# Patient Record
Sex: Male | Born: 1995 | Race: White | Hispanic: Yes | Marital: Married | Smoking: Never smoker
Health system: Southern US, Community
[De-identification: ages and names within clinical notes are randomized; demographics above are authoritative.]

---

## 2020-08-09 ENCOUNTER — Ambulatory Visit: Payer: Self-pay

## 2020-08-09 ENCOUNTER — Encounter: Payer: Self-pay | Admitting: Orthopaedic Surgery

## 2020-08-09 ENCOUNTER — Ambulatory Visit: Payer: BC Managed Care – PPO | Admitting: Orthopaedic Surgery

## 2020-08-09 ENCOUNTER — Other Ambulatory Visit: Payer: Self-pay

## 2020-08-09 VITALS — Ht 71.0 in | Wt 239.0 lb

## 2020-08-09 DIAGNOSIS — M25512 Pain in left shoulder: Secondary | ICD-10-CM | POA: Diagnosis not present

## 2020-08-09 DIAGNOSIS — S43015D Anterior dislocation of left humerus, subsequent encounter: Secondary | ICD-10-CM

## 2020-08-09 NOTE — Progress Notes (Signed)
Office Visit Note   Patient: Jared Graham          Date of Birth: 11/05/1995           MRN: [id number] Visit Date: 08/09/2020              Requested by: No referring provider defined for this encounter. PCP: No primary care provider on file.   Assessment & Plan: Visit Diagnoses:  1. Acute pain of left shoulder   2. Dislocation, shoulder, anterior, left, subsequent encounter     Plan: Jared Graham is accompanied by his wife and here for follow-up evaluation of a dislocation of his left nondominant shoulder.  He was in the [employer] in [place] when on 2 December sustained a dislocation of the shoulder as he was involved in a low crawl and then forcibly hit his elbow on the ground dislocating his shoulder.  He was seen initially at the emergency room in [facility] with the reduction.  He was seen in follow-up by an orthopedist in [place] but was not happy with the encounter.  He still having considerable pain at the point of compromise.  I will order an MRI arthrogram and keep him out of work until he has had further evaluation this certainly is a possibility of a rotator cuff tear or even labral pathology with persistent pain with overhead activity  Follow-Up Instructions: Return After MRI arthrogram left shoulder.   Orders:  Orders Placed This Encounter  Procedures  . XR Shoulder Left  . DL FLUORO GUIDED NEEDLE PLC ASPIRATION / INJECTTION/LOC  . MR Shoulder Left w/ contrast   No orders of the defined types were placed in this encounter.     Procedures: No procedures performed   Clinical Data: No additional findings.   Subjective: Chief Complaint  Patient presents with  . 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  He was seen in at [facility] ED in [place], [place] where the shoulder was reduced. Patient was  subsequently seen for follow up with an orthopedic doctor in [place], however, states no new imaging was obtained and only a follow up appointment was made. He has complaint of decreased range of motion when reaching behind or reaching overhead. He also continues to have numbness along the shoulder. He was told that he tore a muscle along the anterior chest which he describes as sensitive to touch. He has increasing pain depending on movement.  He takes ibuprofen as needed. Patient denies any previous injuries or surgeries to left shoulder. Having trouble working as he is having pain with overhead positioning.  No prior history of shoulder dislocation or subluxation HPI  Review of Systems   Objective: Vital Signs: Ht 5\' 11"  (1.803 m)   Wt 239 lb (108.4 kg)   BMI 33.33 kg/m   Physical Exam Constitutional:      Appearance: He is well-developed and well-nourished.  HENT:     Mouth/Throat:     Mouth: Oropharynx is clear and moist.  Eyes:     Extraocular Movements: EOM normal.     Pupils: Pupils are equal, round, and reactive to light.  Pulmonary:     Effort: Pulmonary effort is normal.  Skin:    General: Skin is warm and dry.  Neurological:     Mental Status: He is alert and oriented to person, place, and time.  Psychiatric:        Mood and Affect: Mood and affect normal.        Behavior: Behavior normal.     Ortho Exam awake alert and oriented x3.  Comfortable sitting.  Did have pain with overhead motion of his left shoulder which required both active and passive positioning.  Pain with about 70 degrees of abduction in the subacromial and anterior aspect of the shoulder.  Biceps intact.  Good grip and good release.  Does have a little loss of sensation a very small area along the lateral deltoid.  No popping or clicking  Specialty Comments:  No specialty comments available.  Imaging: XR Shoulder Left  Result Date: 08/09/2020 Films of the left shoulder obtained in several  projections.  Patient has had a prior dislocation treated in [place] about a month ago.  Shoulder is located.  No evidence of any complicating features    PMFS History: Patient Active Problem List   Diagnosis Date Noted  . Dislocation, shoulder, anterior, left, subsequent encounter 08/09/2020   History reviewed. No pertinent past medical history.  History reviewed. No pertinent family history.  History reviewed. No pertinent surgical history. Social History   Occupational History  . Not on file  Tobacco Use  . Smoking status: Never Smoker  . Smokeless tobacco: Never Used  Substance and Sexual Activity  . Alcohol use: Yes  . Drug use: Not on file  . Sexual activity: Not on file

## 2020-09-07 ENCOUNTER — Ambulatory Visit
Admission: RE | Admit: 2020-09-07 | Discharge: 2020-09-07 | Disposition: A | Discharge status: Home / Self Care | Payer: BC Managed Care – PPO | Source: Ambulatory Visit | Attending: Orthopaedic Surgery | Admitting: Orthopaedic Surgery

## 2020-09-07 ENCOUNTER — Other Ambulatory Visit: Payer: Self-pay

## 2020-09-07 DIAGNOSIS — M25512 Pain in left shoulder: Secondary | ICD-10-CM

## 2020-09-07 IMAGING — XA DG FLUORO GUIDE NDL PLC/BX
3 series · 3 of 3 positions shown · IV contrast (multihance)
Comparison: none

CLINICAL DATA: Left shoulder pain.

EXAM:
LEFT SHOULDER INJECTION UNDER FLUOROSCOPY
TECHNIQUE: An appropriate skin entrance site was determined. The site was
marked, prepped with Betadine, draped in the usual sterile fashion,
and infiltrated locally with 1% lidocaine. A 22 gauge spinal needle
was advanced to the superomedial margin of the humeral head under
intermittent fluoroscopy. 1 mL of 1% lidocaine injected easily. A
mixture of 0.1 mL of MultiHance, 15 mL of Isovue-M 200, and 5 mL of
sterile saline was then used to opacify the left shoulder capsule.
12 mL of this mixture were injected. No immediate complication.
FLUOROSCOPY TIME:  Fluoroscopy Time:  10 seconds
Radiation Exposure Index (if provided by the fluoroscopic device):
9.13 microGray*m^2
Number of Acquired Spot Images: 0

[Series 1: ortho standard · 1 of 1 slices shown (1 of 3)]
[im 1/1]
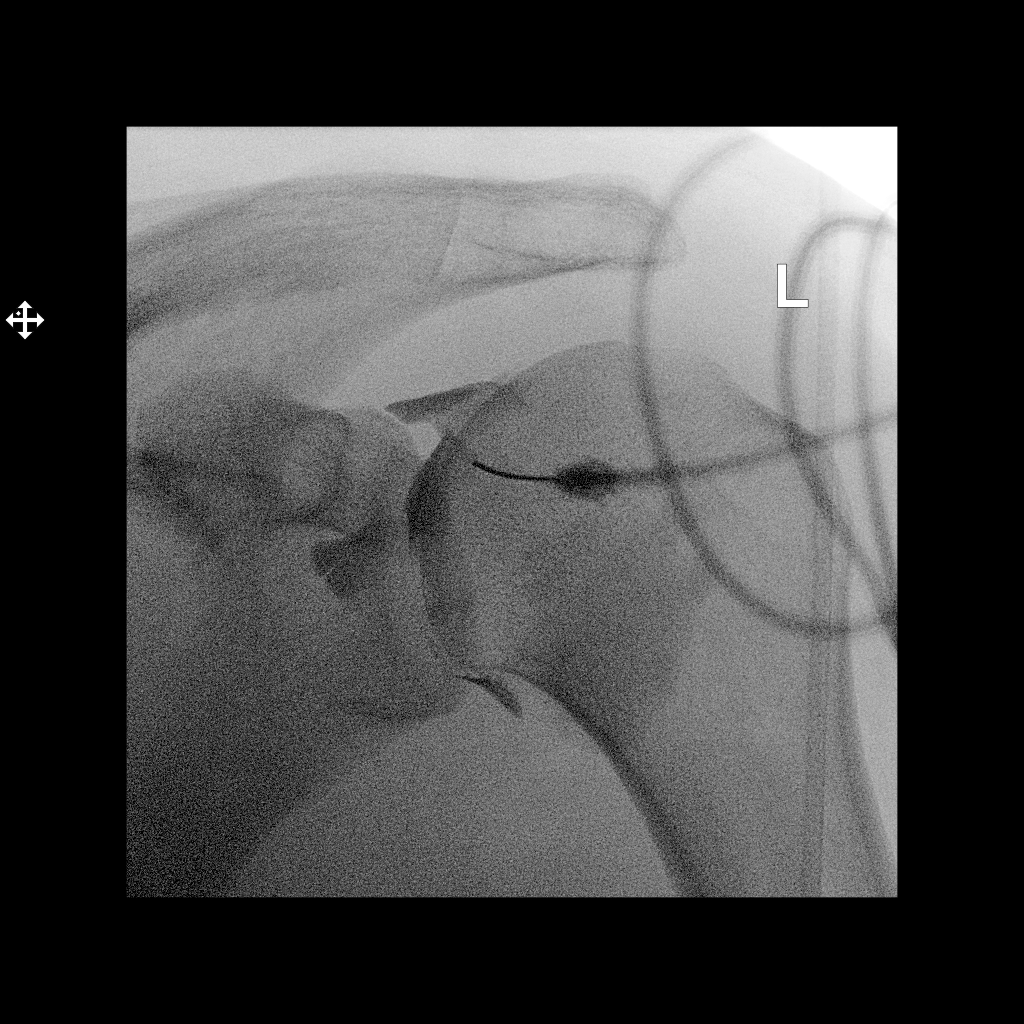

[Series 2: ortho standard · 1 of 1 slices shown (2 of 3)]
[im 1/1]
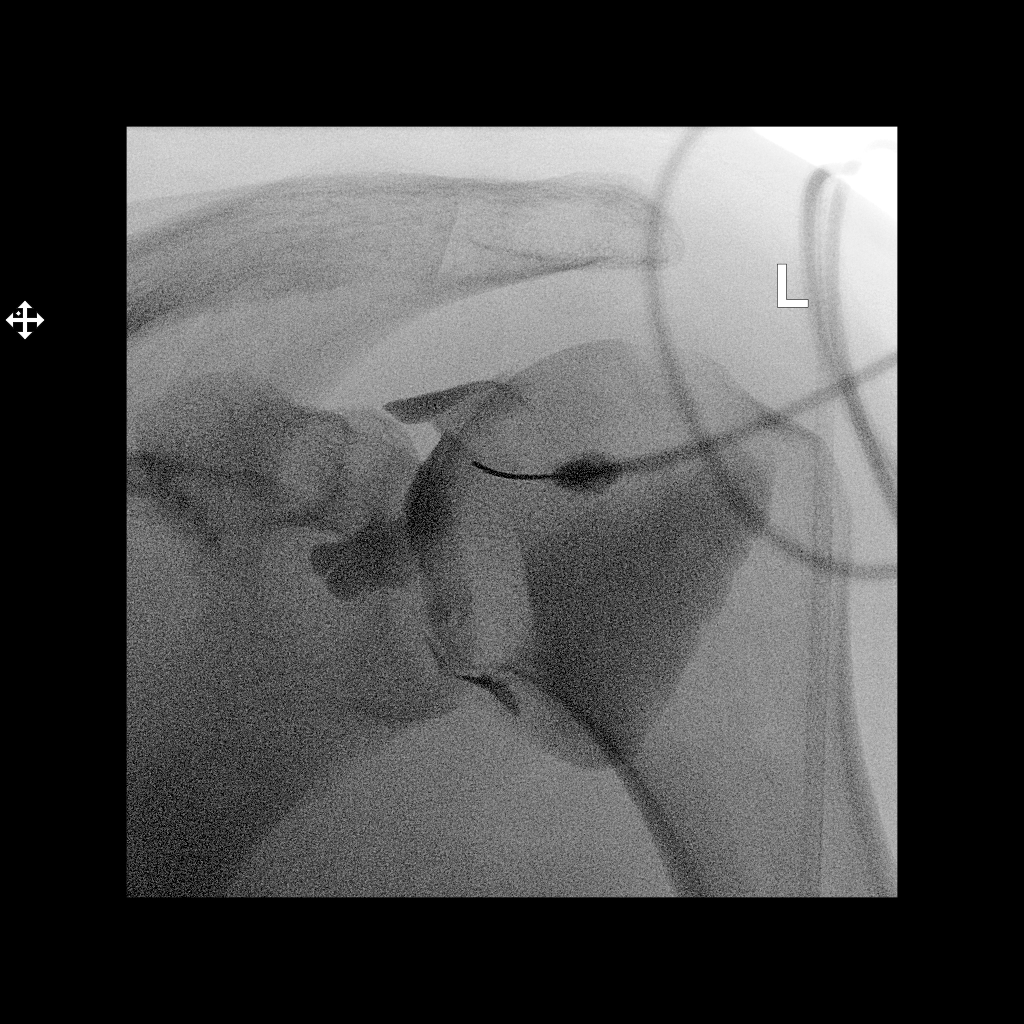

[Series 3: ortho standard · 1 of 1 slices shown (3 of 3)]
[im 1/1]
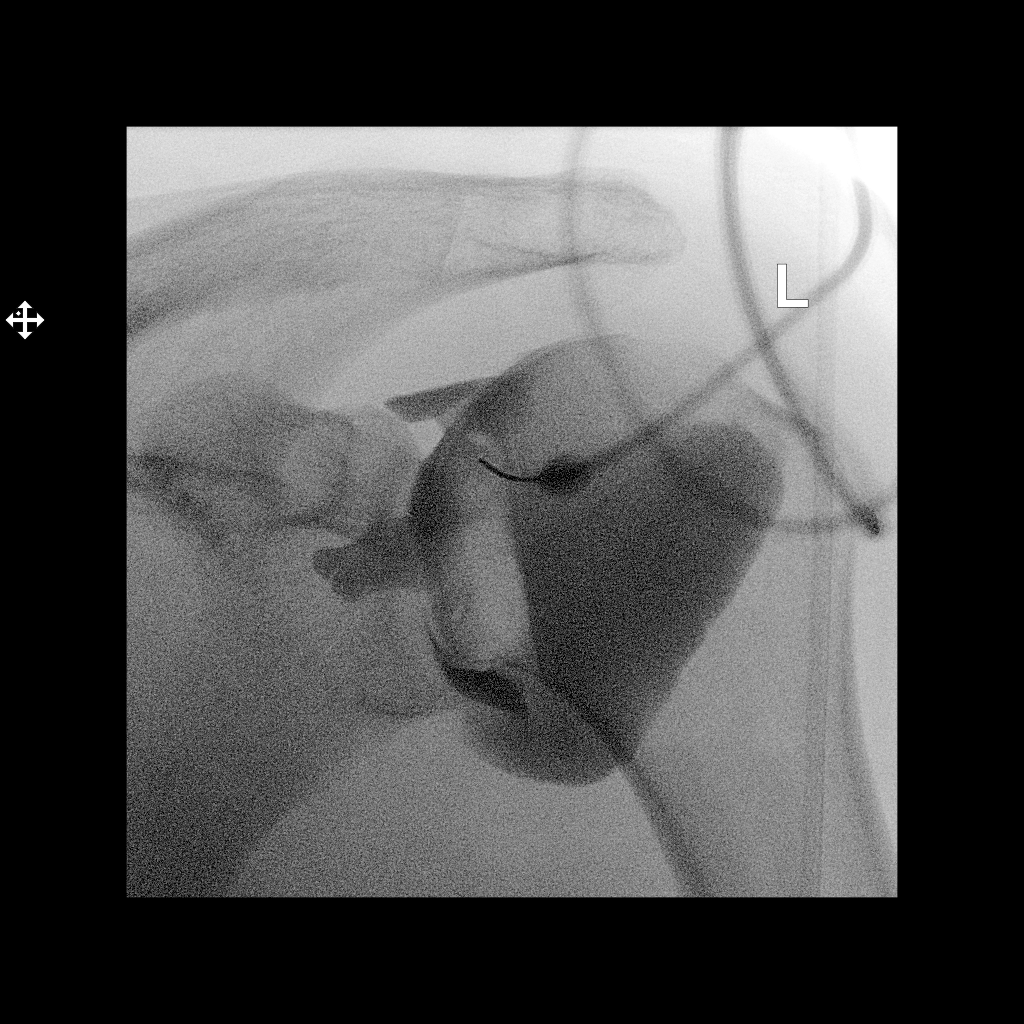

[3 of 3 positions shown; findings below may reference images not displayed]

IMPRESSION: Technically successful left shoulder injection for MRI.

## 2020-09-07 IMAGING — MR MR SHOULDER*L* W/ CM
5 series · 40 of 40 positions shown · IV contrast (agent unspecified)
Comparison: X-ray [DATE]

CLINICAL DATA: Left shoulder pain after dislocation [DATE].
Decreased range of motion.

EXAM:
MR ARTHROGRAM OF THE LEFT SHOULDER
TECHNIQUE: Multiplanar, multisequence MR imaging of the left shoulder was
performed following the administration of intra-articular contrast.
CONTRAST:  See Injection Documentation.

[Series 3: T1 fat-sat · axial · 4.0mm · 0.35mm/px · z∈[-97,+19]mm · 8 of 26 slices shown (1 of 3)]
[im 1/26]
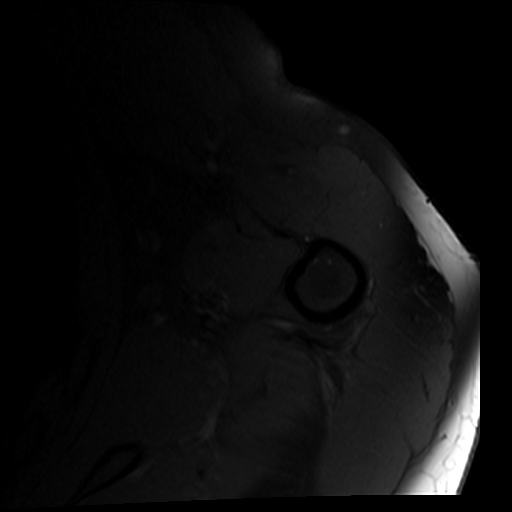
[im 4/26]
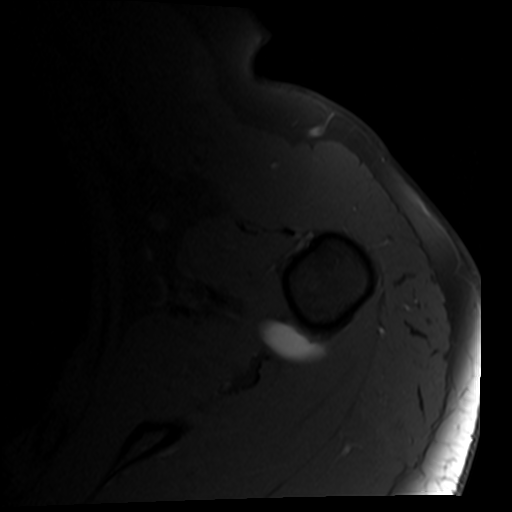
[im 8/26]
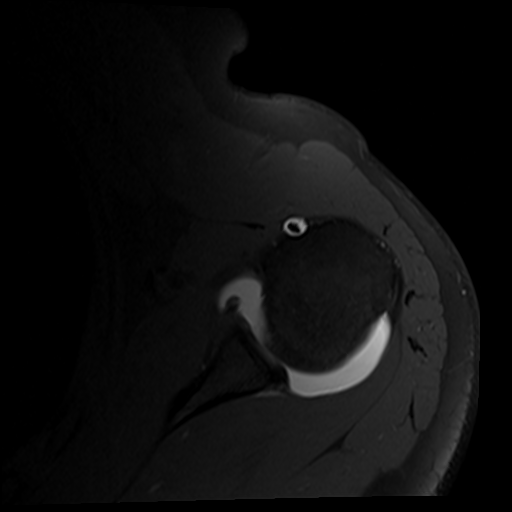
[im 11/26]
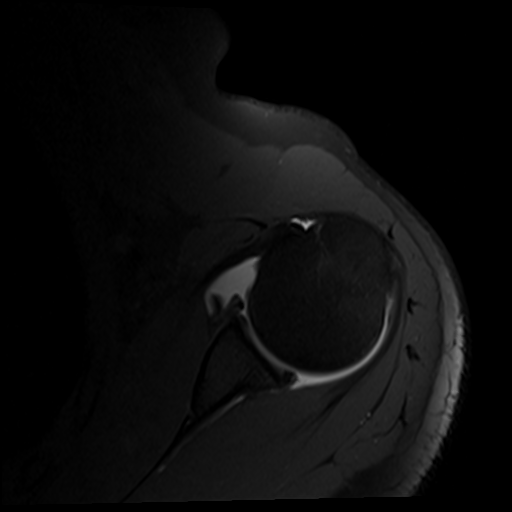
[im 15/26]
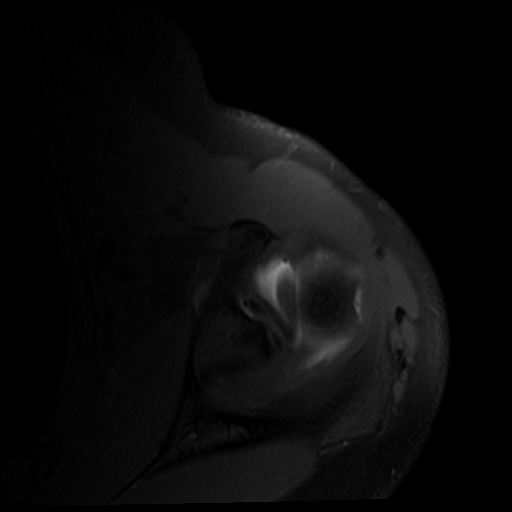
[im 18/26]
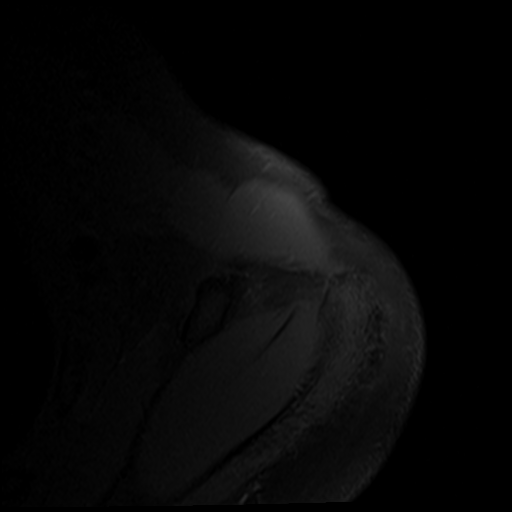
[im 22/26]
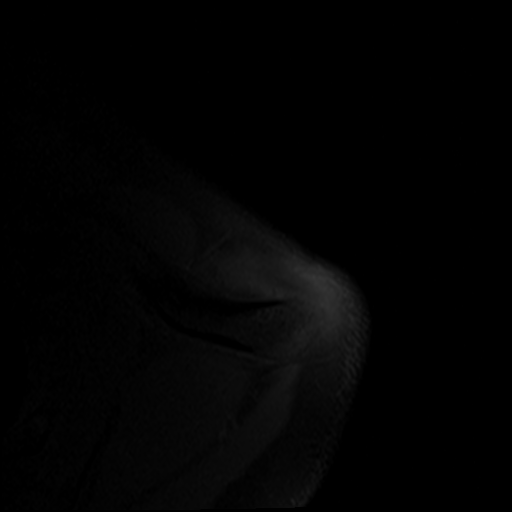
[im 26/26]
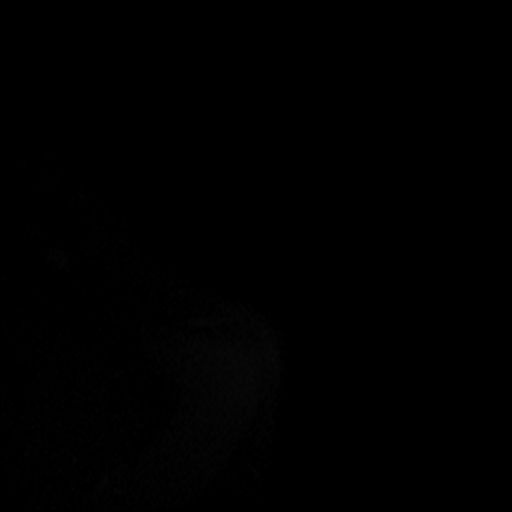

[Series 4: T2 fat-sat · oblique · 4.0mm · 0.62mm/px · 8 of 25 slices shown (1 of 2)]
[im 1/25]
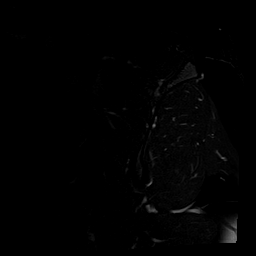
[im 4/25]
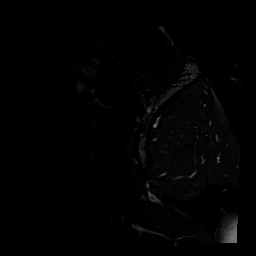
[im 7/25]
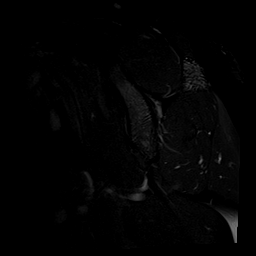
[im 11/25]
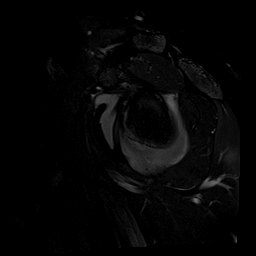
[im 14/25]
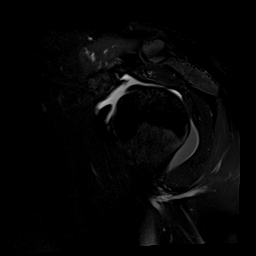
[im 18/25]
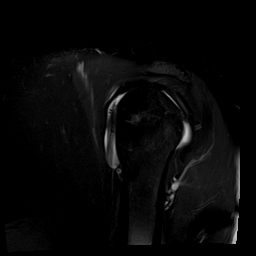
[im 21/25]
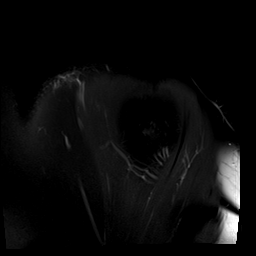
[im 25/25]
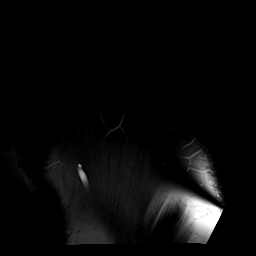

[Series 5: T1 fat-sat · oblique · 4.0mm · 0.62mm/px · 8 of 24 slices shown (2 of 3)]
[im 1/24]
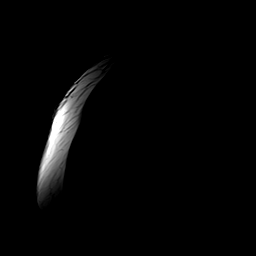
[im 4/24]
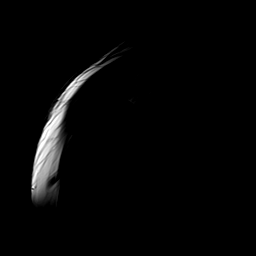
[im 7/24]
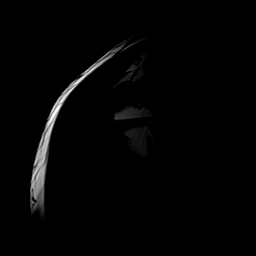
[im 10/24]
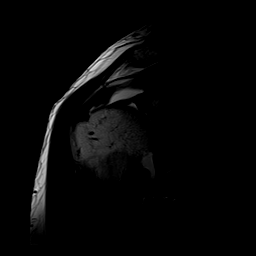
[im 14/24]
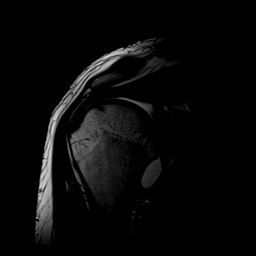
[im 17/24]
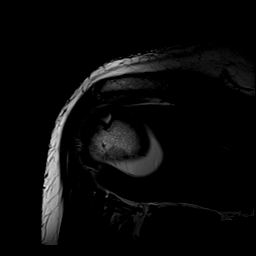
[im 20/24]
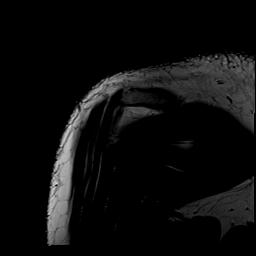
[im 24/24]
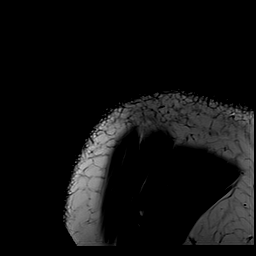

[Series 6: T1 fat-sat · oblique · 4.0mm · 0.62mm/px · 8 of 24 slices shown (3 of 3)]
[im 1/24]
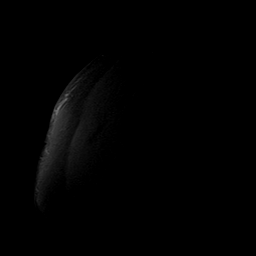
[im 4/24]
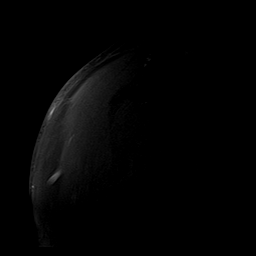
[im 7/24]
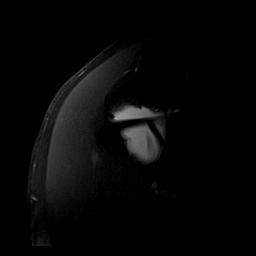
[im 10/24]
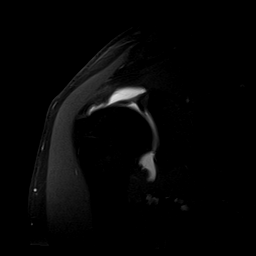
[im 14/24]
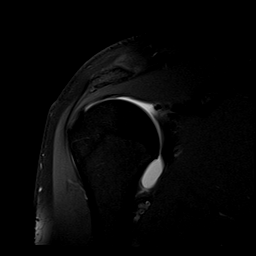
[im 17/24]
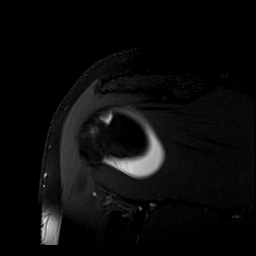
[im 20/24]
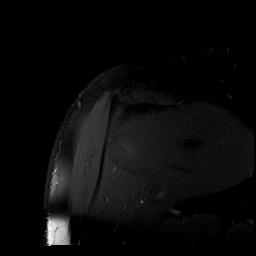
[im 24/24]
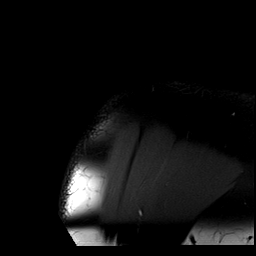

[Series 8: T2 fat-sat · oblique · 4.0mm · 0.62mm/px · 8 of 24 slices shown (2 of 2)]
[im 1/24]
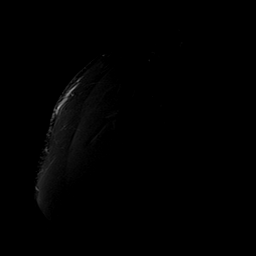
[im 4/24]
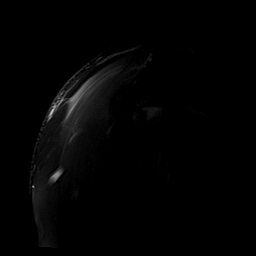
[im 7/24]
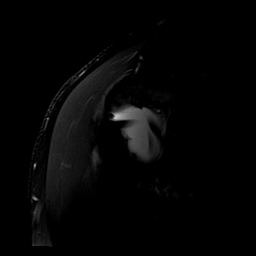
[im 10/24]
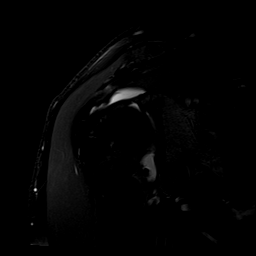
[im 14/24]
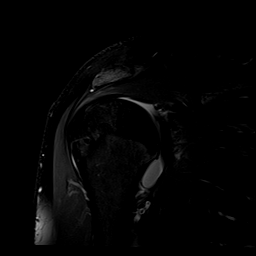
[im 17/24]
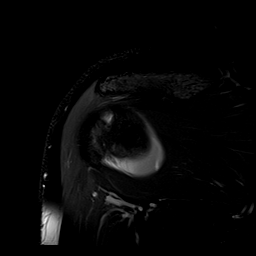
[im 20/24]
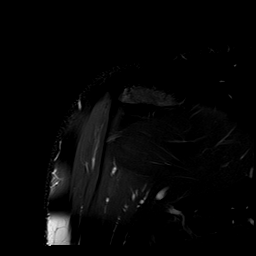
[im 24/24]
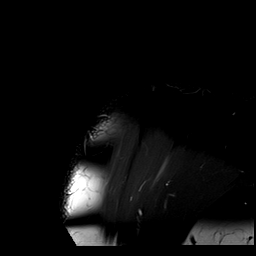

[40 of 40 positions shown; findings below may reference images not displayed]

FINDINGS: Rotator cuff: Supraspinatus, infraspinatus, subscapularis, and teres
minor tendons are intact without tear.

Muscles: Edema located at the myotendinous junction of the
supraspinatus-infraspinatus interdigitation, likely posttraumatic
strain/stretch injury related to reported dislocation. No muscle
atrophy or fatty infiltration.

Biceps long head: Intact and appropriately located without tear.

Acromioclavicular Joint: Normal AC joint. No fluid or contrast
present within the subacromial-subdeltoid bursa.

Glenohumeral Joint: Well distended with injected contrast. Slightly
patulous joint. Slightly prominent cleft at the anterior
chondrolabral junction. No cartilage defect.

Labrum: Superior labral tear extending posteriorly from the biceps
anchor (series 6, images 11-13). Prominent sublabral foramen versus
anterior extension of superior labral tear (series 3, images 13-15).
Irregularity with blunting of the anteroinferior labrum without
detached tear. No glenoid periosteal stripping. Intact glenohumeral
ligaments.

Bones: Small Hill-Sachs impaction deformity of the posterolateral
aspect of the superior humeral head (series 3, image 13) with faint
residual marrow edema. Bony glenoid intact without fracture or bone
marrow edema. Glenohumeral joint alignment maintained without
dislocation.
IMPRESSION: 1. Sequela of recent anterior shoulder dislocation including small
Hill-Sachs impaction deformity with faint residual marrow edema.
2. SLAP tear.
3. Irregularity with blunting of the anteroinferior labrum without
detached tear. No osseous CARO.
4. Intact rotator cuff and biceps tendon.
5. Edema located at the myotendinous junction of the
supraspinatus-infraspinatus interdigitation, likely posttraumatic
strain/stretch injury related to reported dislocation.

## 2020-09-07 MED ORDER — IOPAMIDOL (ISOVUE-M 200) INJECTION 41%
12.0000 mL | Freq: Once | INTRAMUSCULAR | Status: AC
  Administered 2020-09-07: 12 mL via INTRA_ARTICULAR

## 2020-09-12 ENCOUNTER — Other Ambulatory Visit: Payer: Self-pay

## 2020-09-12 ENCOUNTER — Encounter: Payer: Self-pay | Admitting: Orthopaedic Surgery

## 2020-09-12 ENCOUNTER — Ambulatory Visit: Payer: BC Managed Care – PPO | Admitting: Orthopaedic Surgery

## 2020-09-12 VITALS — Ht 71.0 in | Wt 239.0 lb

## 2020-09-12 DIAGNOSIS — S43015D Anterior dislocation of left humerus, subsequent encounter: Secondary | ICD-10-CM

## 2020-09-12 NOTE — Addendum Note (Signed)
Addended by: Wendi Maya on: 09/12/2020 03:18 PM   Modules accepted: Orders

## 2020-09-12 NOTE — Progress Notes (Signed)
Office Visit Note   Patient: Jared Graham           Date of Birth: October 19, 1995           MRN: [id number] Visit Date: 09/12/2020              Requested by: No referring provider defined for this encounter. PCP: Pcp, No   Assessment & Plan: Visit Diagnoses:  1. Dislocation, shoulder, anterior, left, subsequent encounter     Plan: MRI arthrogram demonstrates intact rotator cuff muscles.  There is a slap lesion of the anterior superior labrum.  Also a Hill-Sachs lesion consistent with his shoulder dislocation.  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  Long discussion regarding the findings and treatment options.  I would like Dr. August Saucer to evaluate and see if he thinks physical therapy versus surgical repair would be indicated at this point.  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 August Saucer for consideration of surgical repair of labral tear.   Orders:  No orders of the defined types were placed in this encounter.  No orders of the defined types were placed in this encounter.     Procedures: No procedures performed   Clinical Data: No additional findings.   Subjective: Chief Complaint  Patient presents with  . Left Shoulder - Follow-up    MRI review  Patient presents today for follow up on his left shoulder. He had an MRI performed and is here today for those results. 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  No problems with the shoulder prior to that time.  HPI  Review of Systems   Objective: Vital Signs: Ht 5\' 11"  (1.803 m)   Wt 239 lb (108.4 kg)   BMI 33.33 kg/m   Physical Exam Constitutional:      Appearance: He is well-developed and well-nourished.  HENT:     Mouth/Throat:     Mouth: Oropharynx is clear and moist.  Eyes:     Extraocular Movements: EOM normal.     Pupils: Pupils are equal,  round, and reactive to light.  Pulmonary:     Effort: Pulmonary effort is normal.  Skin:    General: Skin is warm and dry.  Neurological:     Mental Status: He is alert and oriented to person, place, and time.  Psychiatric:        Mood and Affect: Mood and affect normal.        Behavior: Behavior normal.     Ortho Exam awake alert and oriented x3.  Comfortable sitting.  Still lacks a few degrees to full overhead motion of his left shoulder.  Speeds sign was negative.  Some pain with extreme of external rotation in the impingement position.  No apprehension.  No grinding or grating.  Good grip and release  Specialty Comments:  No specialty comments available.  Imaging: No results found.   PMFS History: Patient Active Problem List   Diagnosis Date Noted  . Dislocation, shoulder, anterior, left, subsequent encounter 08/09/2020   History reviewed. No pertinent past medical history.  History reviewed. No pertinent family history.  History reviewed. No pertinent surgical history. Social History   Occupational History  . Not on file  Tobacco Use  . Smoking status: Never Smoker  . Smokeless tobacco: Never Used  Substance and Sexual Activity  .  Alcohol use: Yes  . Drug use: Not on file  . Sexual activity: Not on file

## 2020-09-13 ENCOUNTER — Telehealth: Payer: Self-pay | Admitting: Orthopaedic Surgery

## 2020-09-13 NOTE — Telephone Encounter (Signed)
Patient called. He would like a work note stating how long he will be out of work. He would like it emailed to [email or web address]

## 2020-09-13 NOTE — Telephone Encounter (Signed)
Please advise 

## 2020-09-13 NOTE — Telephone Encounter (Signed)
Note stating that he will be out of work until further notice pending further evaluation of his shoulder injury

## 2020-09-14 NOTE — Telephone Encounter (Signed)
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

## 2020-09-20 ENCOUNTER — Ambulatory Visit (INDEPENDENT_AMBULATORY_CARE_PROVIDER_SITE_OTHER): Payer: BC Managed Care – PPO | Admitting: Orthopedic Surgery

## 2020-09-20 DIAGNOSIS — S43015D Anterior dislocation of left humerus, subsequent encounter: Secondary | ICD-10-CM | POA: Diagnosis not present

## 2020-09-20 NOTE — Progress Notes (Signed)
Office Visit Note   Patient: Jared Graham           Date of Birth: September 20, 1995           MRN: [id number] Visit Date: 09/20/2020 Requested by: Valeria Batman, MD [address] [place],  [place] [address] PCP: Pcp, No  Subjective: Chief Complaint  Patient presents with  . Left Shoulder - Pain    HPI: Jared Graham is a 25 y.o. male who presents to the office complaining of left shoulder pain.  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  This is a first-time dislocation.  Happened as he was diving into a low crawl.  Shoulder was out of socket for 45 minutes before being reduced in the ED under conscious sedation.  Since then he has had persistent left shoulder pain that he localizes to the anterior and superior aspects of the left shoulder.  He has difficulty laying on the left shoulder.  Occasionally wakes with pain to the left shoulder.  He has not been able to return to lifting weights since the event.  Right-hand-dominant.  Denies any history of left shoulder surgery.  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  Patient denies fevers or chills.  Assessment & Plan: Visit Diagnoses:  1. 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  Patient's left shoulder was out of socket for about 45 minutes and had to be reduced in the ED under sedation.  Axillary nerve is intact on exam today.  Excellent rotator cuff strength.  Some decreased range of motion of the left shoulder secondary to disuse most likely.  Patient has previously seen Dr. Cleophas Dunker who ordered an MRI that revealed SLAP tear, small Hill-Sachs deformity, irregularity of the  anterior inferior labrum without detached tear, intact rotator cuff and biceps tendon.  No gross laxity on exam today but he does have positive apprehension sign.  Plan to refer patient to physical therapy.  Follow-up in 4 weeks for clinical recheck.  Out of work for 4 weeks.  Follow-Up Instructions: No follow-ups on file.   Orders:  No orders of the defined types were placed in this encounter.  No orders of the defined types were placed in this encounter.     Procedures: No procedures performed   Clinical Data: No additional findings.  Objective: Vital Signs: There were no vitals taken for this visit.  Physical Exam:  Constitutional: Patient appears well-developed HEENT:  Head: Normocephalic Eyes:EOM are normal Neck: Normal range of motion Cardiovascular: Normal rate Pulmonary/chest: Effort normal Neurologic: Patient is alert Skin: Skin is warm Psychiatric: Patient has normal mood and affect  Ortho Exam: Ortho exam demonstrates left shoulder with 35 degrees external rotation, 90 degrees abduction, 150 degrees forward flexion.  This is compared with right shoulder with 70 degrees external rotation, 140 degrees abduction, 180 degrees forward flexion.  He has decreased internal rotation of the left shoulder behind his back.  Excellent rotator cuff strength with 5/5 motor strength of supraspinatus, infraspinatus, subscapularis.  Axillary nerve is intact with deltoid firing.  5/5 motor strength of bilateral grip strength, finger abduction, pronation/supination,  bicep, tricep, deltoid.  Positive apprehension sign of the left shoulder.  No gross laxity on exam to anterior/posterior forces.  Specialty Comments:  No specialty comments available.  Imaging: No results found.   PMFS History: Patient Active Problem List   Diagnosis Date Noted  . Dislocation, shoulder, anterior, left, subsequent encounter 08/09/2020   No past medical history on file.  No family history on file.   No past surgical history on file. Social History   Occupational History  . Not on file  Tobacco Use  . Smoking status: Never Smoker  . Smokeless tobacco: Never Used  Substance and Sexual Activity  . Alcohol use: Yes  . Drug use: Not on file  . Sexual activity: Not on file

## 2020-09-21 ENCOUNTER — Encounter: Payer: Self-pay | Admitting: Orthopedic Surgery

## 2020-09-23 ENCOUNTER — Encounter: Payer: Self-pay | Admitting: Orthopedic Surgery

## 2020-09-26 ENCOUNTER — Other Ambulatory Visit: Payer: Self-pay

## 2020-09-26 DIAGNOSIS — S43015D Anterior dislocation of left humerus, subsequent encounter: Secondary | ICD-10-CM

## 2020-10-04 ENCOUNTER — Ambulatory Visit: Payer: BC Managed Care – PPO | Admitting: Orthopedic Surgery

## 2020-10-04 ENCOUNTER — Ambulatory Visit: Payer: BC Managed Care – PPO | Admitting: Rehabilitative and Restorative Service Providers"

## 2020-10-18 ENCOUNTER — Other Ambulatory Visit: Payer: Self-pay

## 2020-10-18 ENCOUNTER — Ambulatory Visit (INDEPENDENT_AMBULATORY_CARE_PROVIDER_SITE_OTHER): Payer: Worker's Compensation | Admitting: Orthopedic Surgery

## 2020-10-18 ENCOUNTER — Encounter: Payer: Self-pay | Admitting: Orthopedic Surgery

## 2020-10-18 DIAGNOSIS — S43015D Anterior dislocation of left humerus, subsequent encounter: Secondary | ICD-10-CM

## 2020-10-18 NOTE — Progress Notes (Signed)
Office Visit Note   Patient: Jared Graham           Date of Birth: Dec 04, 1995           MRN: [id number] Visit Date: 10/18/2020 Requested by: No referring provider defined for this encounter. PCP: Pcp, No  Subjective: Chief Complaint  Patient presents with   Left Shoulder - Follow-up    HPI: Jared Graham is a 25 y.o. male who presents to the office complaining of left shoulder pain.  Patient returns following dislocation 07/06/2020.  He has been going to physical therapy [facility] and has 3 sessions remaining.  He does note 50 to 60% improvement since last office visit.  He has no complaint of recurrent instability.  His main complaint is pain that he localizes to the anterior aspect of the left shoulder.  He also notes a mechanical clicking sensation at times with moving his shoulder.  He is right-hand dominant.  He occasionally wakes with pain about 3-4 times over the last week.  He does feel that physical therapy is helping with his range of motion particularly.  No medications that he is taking to help with pain control..                ROS: All systems reviewed are negative as they relate to the chief complaint within the history of present illness.  Patient denies fevers or chills.  Assessment & Plan: Visit Diagnoses:  1. Dislocation, shoulder, anterior, left, subsequent encounter     Plan: Patient is a 25 year old male who presents for reevaluation following left shoulder dislocation on 07/06/2020.  He is improving with physical therapy.  Passive motion is improved compared to last visit.  His pain is continuing to progress as well.  He is eager to return to work.  Plan to finish physical therapy and okay to return to work on 10/30/2020.  He will follow-up with the clinic in early May to recheck and see how his pain is trending.  Patient agreed with this plan.  He will reach out if there are any concerns in the meantime.  No work restrictions at this time.  Recurrent dislocation or  subluxation would potentially merit stabilization surgery.  We will see how he is doing clinically after he finishes therapy for strengthening and after 1 month on the job.  Nurse case manager present for discussion of treatment plan today.  Follow-Up Instructions: No follow-ups on file.   Orders:  No orders of the defined types were placed in this encounter.  No orders of the defined types were placed in this encounter.     Procedures: No procedures performed   Clinical Data: No additional findings.  Objective: Vital Signs: There were no vitals taken for this visit.  Physical Exam:  Constitutional: Patient appears well-developed HEENT:  Head: Normocephalic Eyes:EOM are normal Neck: Normal range of motion Cardiovascular: Normal rate Pulmonary/chest: Effort normal Neurologic: Patient is alert Skin: Skin is warm Psychiatric: Patient has normal mood and affect  Ortho Exam: Ortho exam demonstrates left shoulder with 40 degrees external rotation, 105 degrees abduction, 180 degrees forward flexion.  Excellent 5/5 motor strength of the bilateral supraspinatus, infraspinatus, subscapularis.  No significant laxity to anterior/posterior forces.  He does have negative apprehension sign.  Moderate tenderness to palpation over the bicipital groove.  Negative O'Brien sign.  Specialty Comments:  No specialty comments available.  Imaging: No results found.   PMFS History: Patient Active Problem List   Diagnosis Date Noted  Dislocation, shoulder, anterior, left, subsequent encounter 08/09/2020   No past medical history on file.  No family history on file.  No past surgical history on file. Social History   Occupational History   Not on file  Tobacco Use   Smoking status: Never Smoker   Smokeless tobacco: Never Used  Substance and Sexual Activity   Alcohol use: Yes   Drug use: Not on file   Sexual activity: Not on file

## 2020-12-06 ENCOUNTER — Other Ambulatory Visit: Payer: Self-pay

## 2020-12-06 ENCOUNTER — Ambulatory Visit (INDEPENDENT_AMBULATORY_CARE_PROVIDER_SITE_OTHER): Payer: Worker's Compensation | Admitting: Orthopedic Surgery

## 2020-12-06 DIAGNOSIS — S43015D Anterior dislocation of left humerus, subsequent encounter: Secondary | ICD-10-CM | POA: Diagnosis not present

## 2020-12-09 ENCOUNTER — Encounter: Payer: Self-pay | Admitting: Orthopedic Surgery
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
## 2021-04-27 ENCOUNTER — Telehealth: Payer: Self-pay | Admitting: Orthopedic Surgery

## 2021-05-23 ENCOUNTER — Other Ambulatory Visit: Payer: Self-pay

## 2021-05-23 ENCOUNTER — Ambulatory Visit (INDEPENDENT_AMBULATORY_CARE_PROVIDER_SITE_OTHER): Payer: Worker's Compensation | Admitting: Orthopedic Surgery

## 2021-05-23 DIAGNOSIS — S43015D Anterior dislocation of left humerus, subsequent encounter: Secondary | ICD-10-CM

## 2021-05-27 ENCOUNTER — Encounter: Payer: Self-pay | Admitting: Orthopedic Surgery

## 2021-05-27 NOTE — Progress Notes (Signed)
Office Visit Note   Patient: Jared Graham           Date of Birth: May 31, 1996           MRN: [id number] Visit Date: 05/23/2021 Requested by: No referring provider defined for this encounter. PCP: Pcp, No  Subjective: Chief Complaint  Patient presents with   Left Shoulder - Follow-up     Folllow up left shoulder dislocation on 07/06/20-work comp      HPI: Jared Graham is a 25 y.o. male who presents to the office complaining of continued left shoulder pain.  Patient is s/p left shoulder dislocation event on 07/06/2020.  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  He has no instability events since the initial dislocation.  However he does complain of constant popping that is painful with any activity.  He has a constant dull ache.  Pain does not wake him up at night but it does make it difficult to get to sleep at times.  States that his symptoms are not necessarily worse than they were at his last appointment but just more persistent and have not really gone away at all.  He complains of posterior and anterior pain with popping anteriorly.  He has no lateral sided shoulder pain.  He has tried to return to bench pressing but due to his discomfort he is not able to even do 135 pounds on the benchpress whereas he used to be able to do upwards of 280.  Denies any numbness or tingling or radicular pain..                ROS: All systems reviewed are negative as they relate to the chief complaint within the history of present illness.  Patient denies fevers or chills.  Assessment & Plan: Visit Diagnoses:  1. Dislocation, shoulder, anterior, left, subsequent encounter     Plan: Patient is a 25 year old male who presents for reevaluation several months out from his last office visit.  He has history of left shoulder dislocation on 07/06/2020.  He has had no further instability events but he does have constant popping in the anterior shoulder with pain mostly in the anterior shoulder with some  posterior pain as well.  He has not been able to return to the activities that he would like to do.  No weakness on exam today and negative apprehension sign.  MRI from February 2022 demonstrated SLAP tear with no definitive anterior-inferior labrum tear.  Discussed options available to patient which is at this point mostly either living with his current symptoms or pursuing surgical intervention.  Surgical intervention in this case would be arthroscopy with evaluation of the anterior inferior labrum and likely imbrication of the anterior inferior glenohumeral ligament.  He did have a discrete dislocation event and has had continued pain since that time.  In addition he has superior labral pathology.  Based on his age would favor SLAP repair over tenodesis at this time.  Discussed the recovery timeframe following surgery as well as lifting restrictions.  Discussed the risks and benefits of the procedure.  After discussion with the patient, he would like to discuss this with his wife and family and he will call the office if he would like to proceed with surgery.  Follow-Up Instructions: No follow-ups on file.   Orders:  No orders of the defined types were placed in this encounter.  No orders of the defined types were placed in this encounter.     Procedures: No  procedures performed   Clinical Data: No additional findings.  Objective: Vital Signs: There were no vitals taken for this visit.  Physical Exam:  Constitutional: Patient appears well-developed HEENT:  Head: Normocephalic Eyes:EOM are normal Neck: Normal range of motion Cardiovascular: Normal rate Pulmonary/chest: Effort normal Neurologic: Patient is alert Skin: Skin is warm Psychiatric: Patient has normal mood and affect  Ortho Exam: Ortho exam demonstrates left shoulder with 70 degrees external rotation, 100 degrees abduction, 180 degrees forward flexion.  Same range of motion on the right shoulder.  Excellent rotator cuff  strength of supra, infra, subscap of bilateral shoulders.  No tenderness over the [facility] joint bilaterally.  Does have asymmetric moderate tenderness of the bicipital groove of the left shoulder that is not present on the right.  Positive O'Brien sign.  Negative Hornblower sign.  Negative external rotation lag sign.  Negative belly press test.  Axillary nerve intact with deltoid firing.  5/5 motor strength of bilateral grip strength, finger abduction, pronation/supination, bicep, tricep, deltoid.  No Popeye deformity noted.  Negative apprehension sign.  No significant laxity to anterior or posterior directed forces of the left shoulder.  Specialty Comments:  No specialty comments available.  Imaging: No results found.   PMFS History: Patient Active Problem List   Diagnosis Date Noted   Dislocation, shoulder, anterior, left, subsequent encounter 08/09/2020   No past medical history on file.  No family history on file.  No past surgical history on file. Social History   Occupational History   Not on file  Tobacco Use   Smoking status: Never   Smokeless tobacco: Never  Substance and Sexual Activity   Alcohol use: Yes   Drug use: Not on file   Sexual activity: Not on file

## 2021-08-20 ENCOUNTER — Telehealth: Payer: Self-pay | Admitting: Orthopedic Surgery

## 2021-08-20 NOTE — Telephone Encounter (Signed)
Pt wife called and states pt has dislocated shoulder again and would like to come in to follow up. Pt states he is very sore and would like to come in this week if possible to be seen.    CB [phone]

## 2021-08-20 NOTE — Telephone Encounter (Signed)
scheduled

## 2021-08-22 ENCOUNTER — Other Ambulatory Visit: Payer: Self-pay

## 2021-08-22 ENCOUNTER — Encounter: Payer: Self-pay | Admitting: Orthopedic Surgery

## 2021-08-22 ENCOUNTER — Ambulatory Visit (INDEPENDENT_AMBULATORY_CARE_PROVIDER_SITE_OTHER): Payer: BC Managed Care – PPO | Admitting: Surgical

## 2021-08-22 DIAGNOSIS — M25512 Pain in left shoulder: Secondary | ICD-10-CM | POA: Diagnosis not present

## 2021-08-22 NOTE — Progress Notes (Signed)
Office Visit Note   Patient: Jared Graham           Date of Birth: 1995-11-12           MRN: [id number] Visit Date: 08/22/2021 Requested by: No referring provider defined for this encounter. PCP: Pcp, No  Subjective: Chief Complaint  Patient presents with   Left Shoulder - Dislocation    HPI: Jared Graham is a 26 y.o. male who presents to the office complaining of left shoulder injury.  Patient states that he sustained a dislocation of the left shoulder on 08/18/2021 when he was getting off his couch.  He was laying down and pushed up with his left arm when his left hand slipped in between his couch cushions and he jarred his shoulder causing a shoulder dislocation.  He felt that the shoulder dislocated posteriorly.  He has had 1 prior shoulder dislocation making this his second instability event.  The shoulder was dislocated for slightly less than a minute and spontaneously reduced.  He did not seek any sort of medical attention at that time.  Denies any persistent weakness with ADLs or any numbness/tingling since the event.  He feels increased soreness in the shoulder diffusely around the shoulder and shoulder blade.  No radicular pain down the arm.  He works as a Hydrographic surveyor..                ROS: All systems reviewed are negative as they relate to the chief complaint within the history of present illness.  Patient denies fevers or chills.  Assessment & Plan: Visit Diagnoses:  1. Acute pain of left shoulder     Plan: Patient is a 26 year old male who presents for evaluation of left shoulder dislocation event.  He has had previous anterior shoulder dislocation in December 2021 with MRI in February 2022 that demonstrated SLAP tear with blunting of the inferior anterior labrum as well as a Hill-Sachs deformity.  Most recent event felt like it was a posterior dislocation though his previous dislocation was anterior.  This is his second dislocation.  He was considering surgery  along with his wife after last visit several months ago but with this new instability event, he is strongly considering surgery which is understandable.  Plan to order MRI of the left shoulder with arthrogram for further evaluation of shoulder instability.  He is neurovascularly intact on exam today.  Follow-up after MRI to review results and discuss potential treatment options.  No gross instability on exam today or any weakness.  Plan for him to return to work but cautioned him against any heavy lifting.  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  Follow-Up Instructions: No follow-ups on file.   Orders:  Orders Placed This Encounter  Procedures   MR Shoulder Left w/ contrast   Arthrogram   No orders of the defined types were placed in this encounter.     Procedures: No procedures performed   Clinical Data: No additional findings.  Objective: Vital Signs: There were no vitals taken for this visit.  Physical Exam:  Constitutional: Patient appears well-developed HEENT:  Head: Normocephalic Eyes:EOM are normal Neck: Normal range of motion Cardiovascular: Normal rate Pulmonary/chest: Effort normal Neurologic: Patient is alert Skin: Skin is warm Psychiatric: Patient has normal mood and affect  Ortho Exam: Ortho exam demonstrates left shoulder with well-preserved passive and active range of motion.  Excellent rotator cuff strength of supra,  infra, subscap.  Axillary nerve intact with deltoid firing.  Intact sensation over the deltoid.  No deformity concerning for current dislocated shoulder.  Feels fairly stable to anterior/posterior directed forces on exam today.  Patient is apprehensive with range of motion of the shoulder but this is mostly due to soreness and not feelings of the shoulder dislocating again.  Tenderness over the bicipital groove.  Intact EPL, wrist extension, grip strength, finger  abduction, pronation/supination, bicep, tricep, deltoid.  No sulcus sign noted.  2+ radial pulse of bilateral upper extremities.  Specialty Comments:  No specialty comments available.  Imaging: No results found.   PMFS History: Patient Active Problem List   Diagnosis Date Noted   Dislocation, shoulder, anterior, left, subsequent encounter 08/09/2020   No past medical history on file.  No family history on file.  No past surgical history on file. Social History   Occupational History   Not on file  Tobacco Use   Smoking status: Never   Smokeless tobacco: Never  Substance and Sexual Activity   Alcohol use: Yes   Drug use: Not on file   Sexual activity: Not on file

## 2021-08-29 ENCOUNTER — Ambulatory Visit: Payer: BC Managed Care – PPO | Admitting: Orthopedic Surgery

## 2021-09-18 ENCOUNTER — Other Ambulatory Visit: Payer: Self-pay

## 2021-09-18 ENCOUNTER — Ambulatory Visit
Admission: RE | Admit: 2021-09-18 | Discharge: 2021-09-18 | Disposition: A | Discharge status: Home / Self Care | Payer: Worker's Compensation | Source: Ambulatory Visit | Attending: Surgical | Admitting: Surgical

## 2021-09-18 DIAGNOSIS — M25512 Pain in left shoulder: Secondary | ICD-10-CM

## 2021-09-18 IMAGING — XA DG FLUORO GUIDE NDL PLC/BX
1 series · 1 of 1 positions shown · IV contrast (multihance)
Comparison: none

CLINICAL DATA: Acute pain of left shoulder.  Multiple dislocations.

EXAM:
LEFT SHOULDER INJECTION UNDER FLUOROSCOPY
TECHNIQUE: An appropriate skin entrance site was determined. The site was
marked, prepped with Betadine, draped in the usual sterile fashion,
and infiltrated locally with 1% lidocaine. A 22 gauge spinal needle
was advanced to the superomedial margin of the humeral head under
intermittent fluoroscopy. 1 mL of 1% lidocaine injected easily. A
mixture of 0.1 mL of MultiHance, 15 mL of Isovue-M 200, and 5 mL of
sterile saline was then used to opacify the left shoulder capsule. A
total of 12 mL of this mixture was injected. There was no immediate
complication.
FLUOROSCOPY:
Radiation Exposure Index (as provided by the fluoroscopic device):
10.04 mGy Kerma

[Series 1: ortho adipose · 1 of 1 slices shown]
[im 1/1]
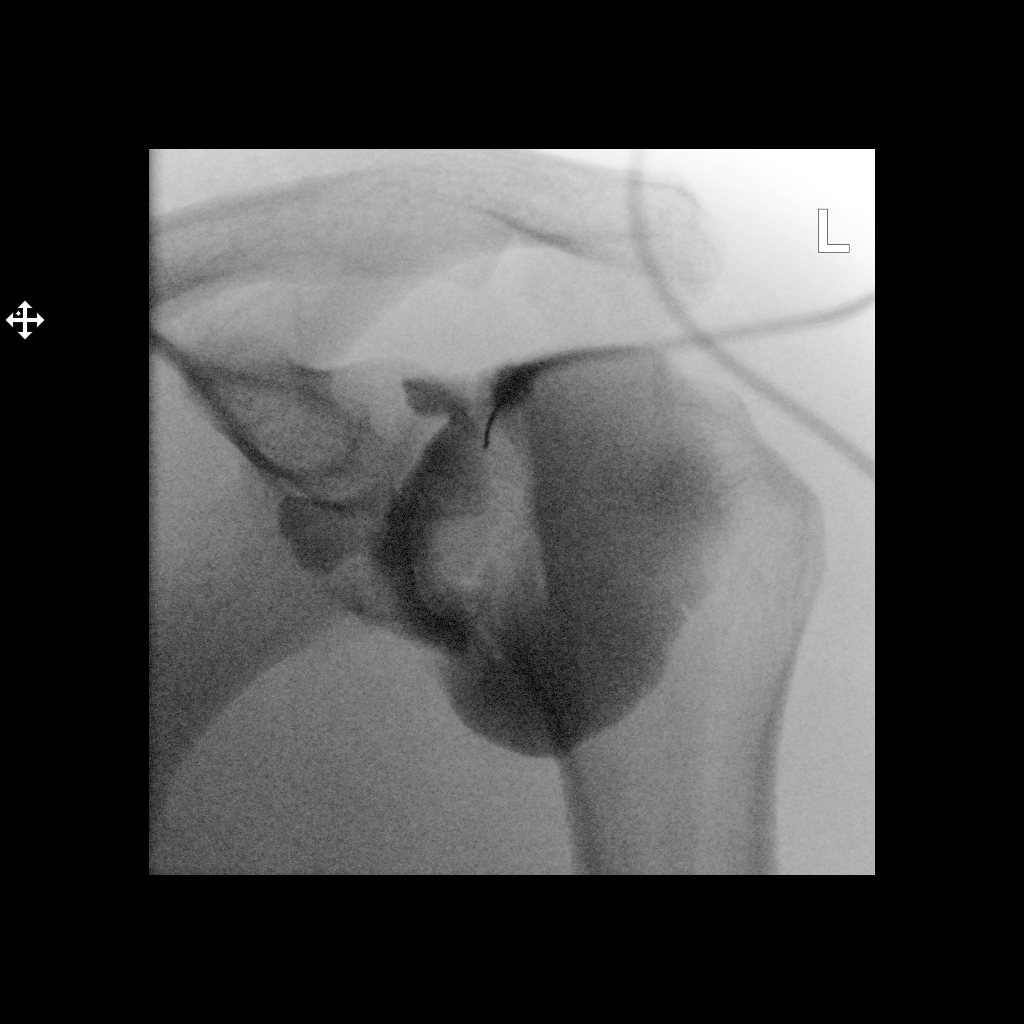

[1 of 1 positions shown; findings below may reference images not displayed]

IMPRESSION: Technically successful left shoulder injection for MRI.

## 2021-09-18 IMAGING — MR MR SHOULDER*L* W/ CM
5 series · 40 of 40 positions shown · IV contrast (agent unspecified)
Comparison: MRI left shoulder [DATE].

CLINICAL DATA: History of anterior left shoulder dislocation
[DATE]. The patient has suffered continued shoulder instability
believes he suffered a posterior dislocation in [DATE].

EXAM:
MR ARTHROGRAM OF THE LEFT SHOULDER
TECHNIQUE: Multiplanar, multisequence MR imaging of the left shoulder was
performed following the administration of intra-articular contrast.
CONTRAST:  See Injection Documentation.

[Series 3: T1 fat-sat · axial · 4.0mm · 0.31mm/px · z∈[-55,+54]mm · 7 of 23 slices shown (1 of 3)]
[im 1/23]
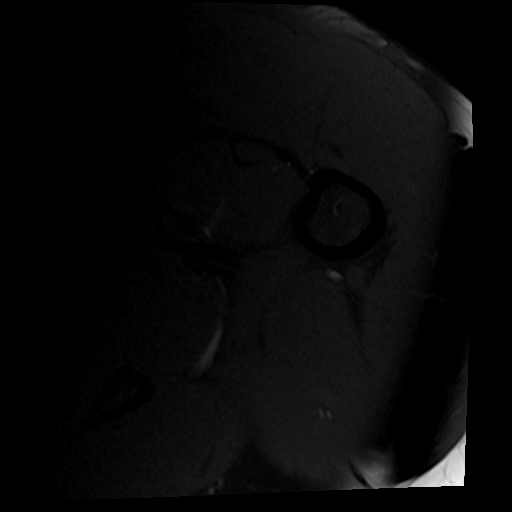
[im 4/23]
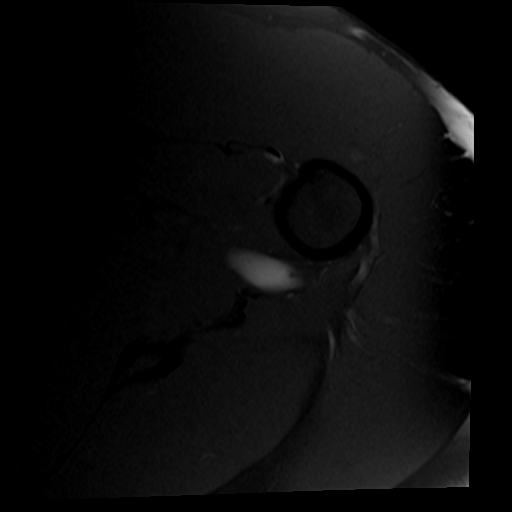
[im 8/23]
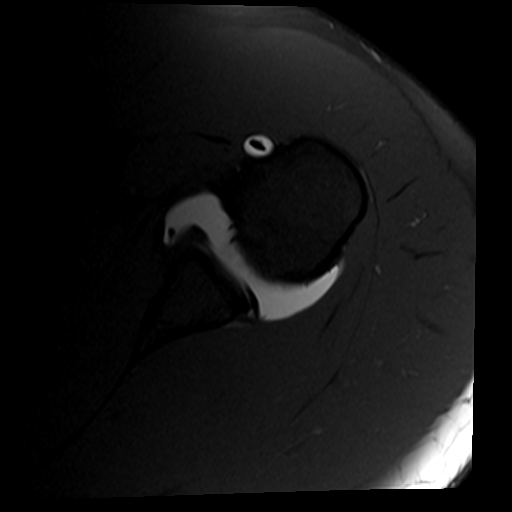
[im 12/23]
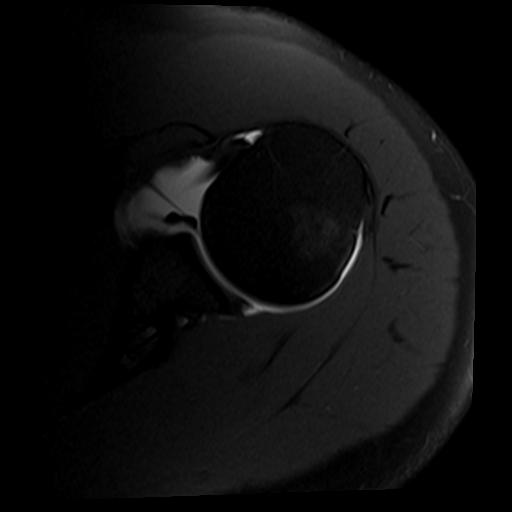
[im 15/23]
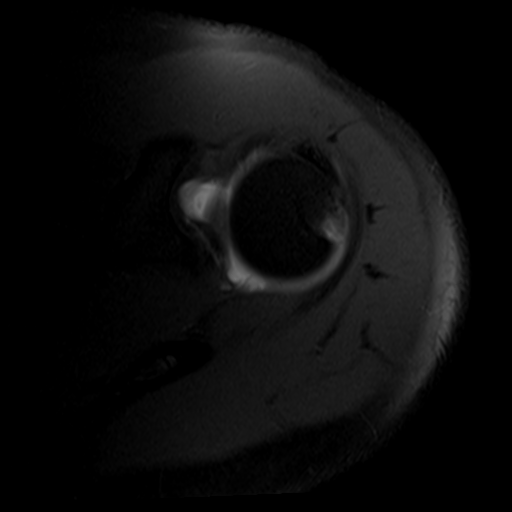
[im 19/23]
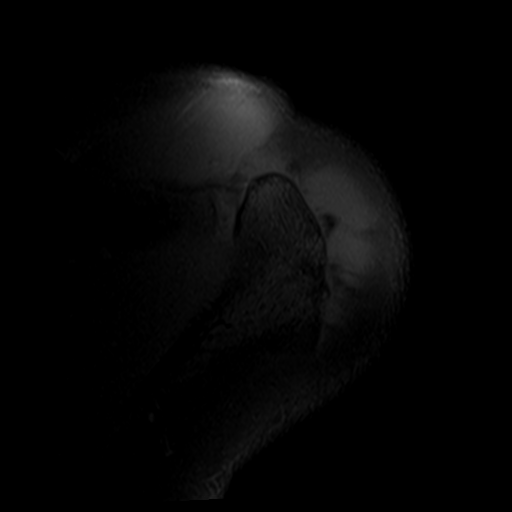
[im 23/23]
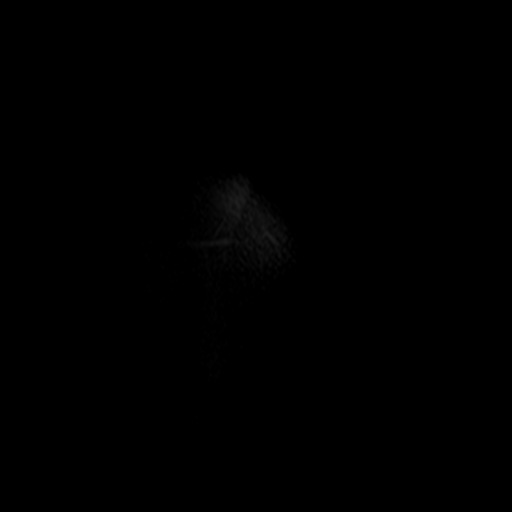

[Series 4: T2 fat-sat · oblique · 4.0mm · 0.59mm/px · 9 of 25 slices shown (1 of 2)]
[im 1/25]
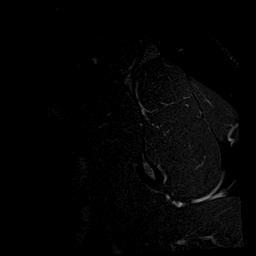
[im 4/25]
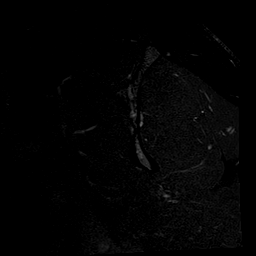
[im 7/25]
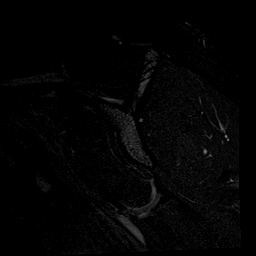
[im 10/25]
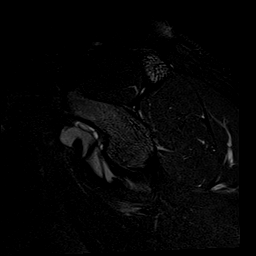
[im 13/25]
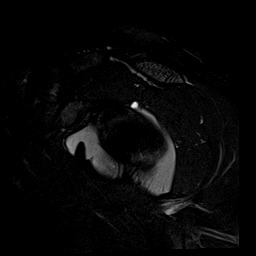
[im 16/25]
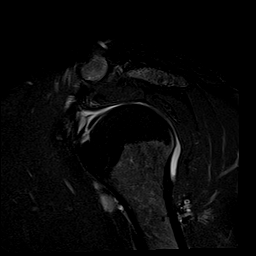
[im 19/25]
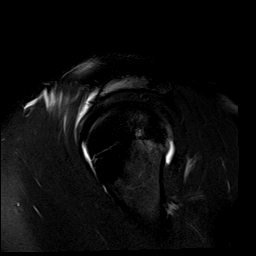
[im 22/25]
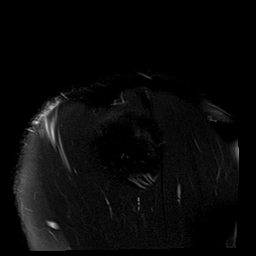
[im 25/25]
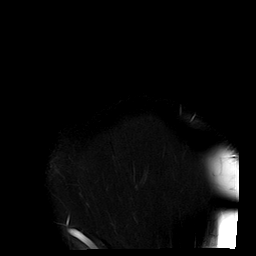

[Series 5: T1 fat-sat · oblique · 4.0mm · 0.59mm/px · 8 of 22 slices shown (2 of 3)]
[im 1/22]
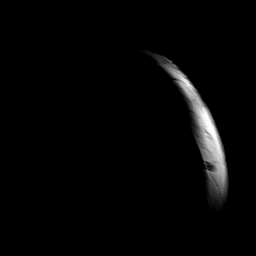
[im 4/22]
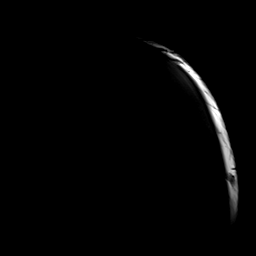
[im 7/22]
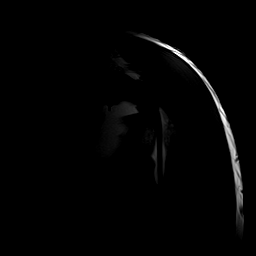
[im 10/22]
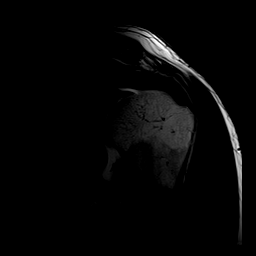
[im 13/22]
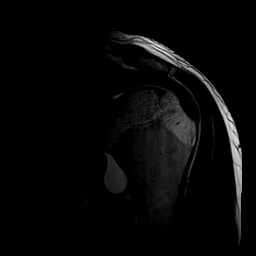
[im 16/22]
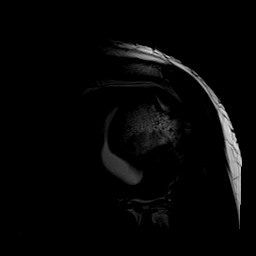
[im 19/22]
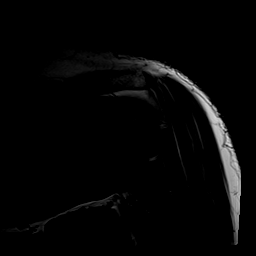
[im 22/22]
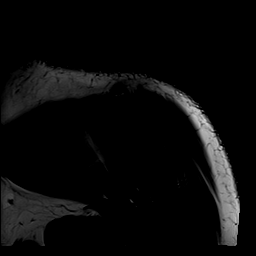

[Series 6: T1 fat-sat · oblique · 4.0mm · 0.59mm/px · 8 of 22 slices shown (3 of 3)]
[im 1/22]
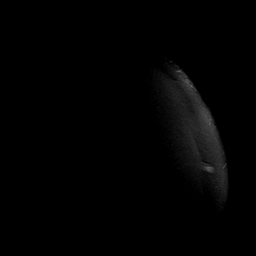
[im 4/22]
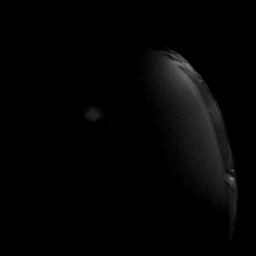
[im 7/22]
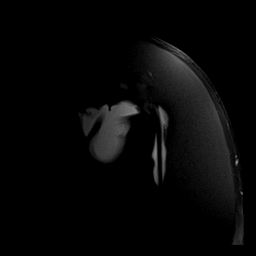
[im 10/22]
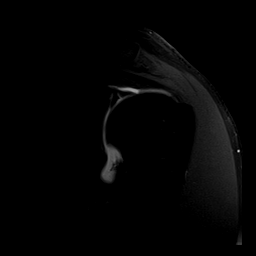
[im 13/22]
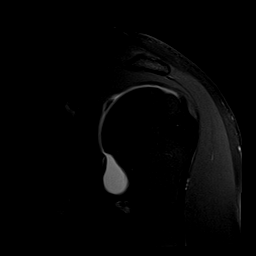
[im 16/22]
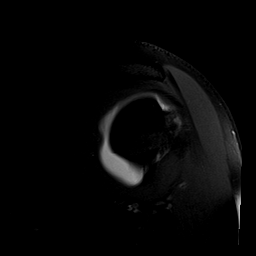
[im 19/22]
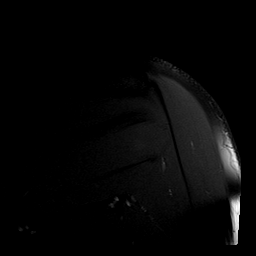
[im 22/22]
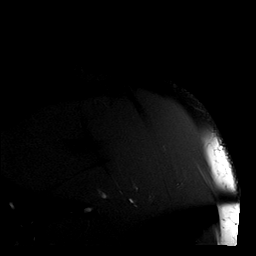

[Series 7: T2 fat-sat · oblique · 4.0mm · 0.59mm/px · 8 of 22 slices shown (2 of 2)]
[im 1/22]
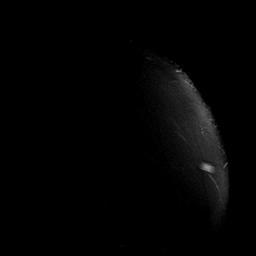
[im 4/22]
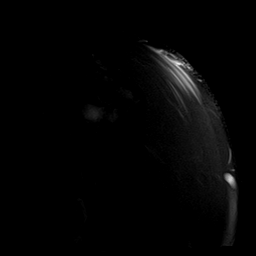
[im 7/22]
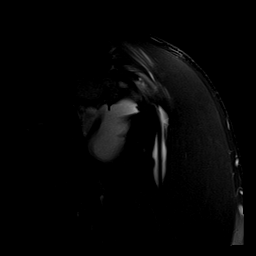
[im 10/22]
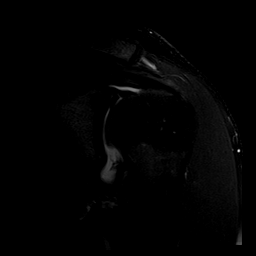
[im 13/22]
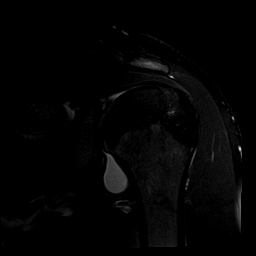
[im 16/22]
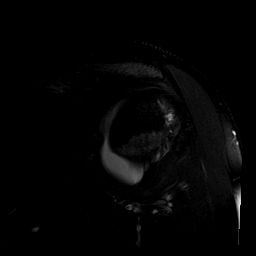
[im 19/22]
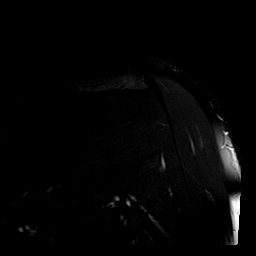
[im 22/22]
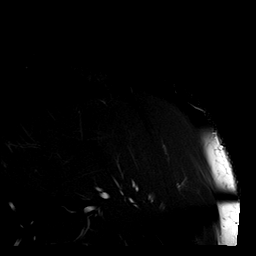

[40 of 40 positions shown; findings below may reference images not displayed]

FINDINGS: Rotator cuff: Intact.

Muscles: Normal without atrophy or focal lesion.

Biceps long head: Contrast undermines the long head of biceps at its
attachment to the superior labrum worrisome for a partial tear.

Acromioclavicular Joint: Normal.

Glenohumeral Joint: Distended with contrast.

Labrum: The patient has a circumferential or near circumferential
labral tear. The anteroinferior labrum is displaced approximately
0.3 cm.

Bones: Small Hill-Sachs lesion with marrow edema is identified. No
bony Bankart.
IMPRESSION: Findings consistent with recent anterior shoulder dislocation. The
patient now has a near circumferential tear of the glenoid labrum
and the anteroinferior labrum is mildly displaced. Negative for bony
Bankart.

Findings worrisome for a new partial tear of the long head of biceps
from the superior labrum.

## 2021-09-18 MED ORDER — IOPAMIDOL (ISOVUE-M 200) INJECTION 41%
13.0000 mL | Freq: Once | INTRAMUSCULAR | Status: AC
  Administered 2021-09-18: 13 mL via INTRA_ARTICULAR

## 2021-09-26 ENCOUNTER — Other Ambulatory Visit: Payer: Self-pay

## 2021-09-26 ENCOUNTER — Ambulatory Visit (INDEPENDENT_AMBULATORY_CARE_PROVIDER_SITE_OTHER): Payer: Worker's Compensation | Admitting: Orthopedic Surgery

## 2021-09-26 VITALS — Ht 71.0 in | Wt 239.0 lb

## 2021-09-26 DIAGNOSIS — S43015D Anterior dislocation of left humerus, subsequent encounter: Secondary | ICD-10-CM

## 2021-09-30 ENCOUNTER — Encounter: Payer: Self-pay | Admitting: Orthopedic Surgery
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
## 2021-12-17 NOTE — Telephone Encounter (Signed)
err

## 2022-01-04 ENCOUNTER — Telehealth: Payer: Self-pay | Admitting: Orthopedic Surgery

## 2022-01-04 NOTE — Telephone Encounter (Signed)
Holding to discuss with Dr Dean 

## 2022-01-04 NOTE — Telephone Encounter (Signed)
Patient's wife Dedra Skeens calling to let you know a hearing date has been set for 01-15-22 on the [facility] injury date 07-09-2020 pertaining to left shoulder.  They would like to know how far out we are scheduling surgery.  Please provide surgery sheet if surgery is in order.  If any questions call [phone].

## 2022-01-09 NOTE — Telephone Encounter (Signed)
Blue sheet given to debbie I am oot 6/13

## 2022-01-30 ENCOUNTER — Telehealth: Payer: Self-pay | Admitting: Orthopedic Surgery

## 2022-01-30 DIAGNOSIS — M25512 Pain in left shoulder: Secondary | ICD-10-CM

## 2022-01-30 NOTE — Telephone Encounter (Signed)
Patient will be scheduled for left shoulder labral repair, possible SLAP repair on 03-04-22 at [facility]. Patient's wife Dedra Skeens is calling to see about physical therapy once patient has surgery.  She states Dr. August Saucer mentioned 3 weeks post surgery the patient could start.  They are trying to plan ahead.  [facility] in [place] is the PT facility patient would like to do the PT sessions.  Please provide the script and send to patient's home address on file.

## 2022-01-31 NOTE — Telephone Encounter (Signed)
Ok for pt for prom er less than 30 ff less than 90 and abduct less than 90 for 2 x week for 3 w  beginning 3 w after procedure thx

## 2022-02-21 ENCOUNTER — Telehealth: Payer: Self-pay | Admitting: Orthopedic Surgery

## 2022-02-21 NOTE — Telephone Encounter (Signed)
Patient's wife calling Dedra Skeens calling requesting note for Josh employer stating he will be out of work from July 31st (day of surgery) until recovery period of approximately 4 months.  To whom it may concern can be used since it will be going to multiple people.  Please e-mail or send through [facility] CHART.  Please call patient and let them know once this is been done.

## 2022-02-22 NOTE — Telephone Encounter (Signed)
Sure thx

## 2022-02-22 NOTE — Telephone Encounter (Signed)
sent 

## 2022-02-27 ENCOUNTER — Telehealth: Payer: Self-pay | Admitting: Orthopedic Surgery

## 2022-02-27 NOTE — Telephone Encounter (Signed)
Pt's nurse case manager [name] is asking fr pt to be seen later of pt already appt. Asking for 3 pm. I explained we have no opens and she stated its a 15 min visit. Explained all Dr. August Saucer appt are 15 min except back appt. Call Jessica at [phone]

## 2022-02-28 NOTE — Telephone Encounter (Signed)
IC advised ok to come at 3pm just may have to wait a little longer

## 2022-03-04 ENCOUNTER — Encounter: Payer: Self-pay | Admitting: Orthopedic Surgery

## 2022-03-04 ENCOUNTER — Other Ambulatory Visit: Payer: Self-pay | Admitting: Surgical

## 2022-03-04 ENCOUNTER — Telehealth: Payer: Self-pay | Admitting: Orthopedic Surgery

## 2022-03-04 DIAGNOSIS — S43492D Other sprain of left shoulder joint, subsequent encounter: Secondary | ICD-10-CM

## 2022-03-04 DIAGNOSIS — S43432A Superior glenoid labrum lesion of left shoulder, initial encounter: Secondary | ICD-10-CM

## 2022-03-04 DIAGNOSIS — S43432D Superior glenoid labrum lesion of left shoulder, subsequent encounter: Secondary | ICD-10-CM | POA: Diagnosis not present

## 2022-03-04 MED ORDER — METHOCARBAMOL 500 MG PO TABS: 500.0000 mg | ORAL_TABLET | Freq: Three times a day (TID) | ORAL | 1 refills | Status: AC | PRN

## 2022-03-04 MED ORDER — CELECOXIB 100 MG PO CAPS: 100.0000 mg | ORAL_CAPSULE | Freq: Two times a day (BID) | ORAL | 0 refills | Status: AC

## 2022-03-04 MED ORDER — OXYCODONE-ACETAMINOPHEN 5-325 MG PO TABS: 1.0000 | ORAL_TABLET | ORAL | 0 refills | Status: AC | PRN

## 2022-03-04 NOTE — Telephone Encounter (Signed)
Jared Graham had surgery today at the surgical center.  His wife is calling to find out what pharmacy his post op medications were called to.  He says he advised the nurse that he uses [facility] in [place], but he does not remember if she took down the information.  Please call [phone]. They are looking to pick them up on their way home.

## 2022-03-04 NOTE — Telephone Encounter (Signed)
IC advised submitted earlier today by Franky Macho

## 2022-03-11 ENCOUNTER — Ambulatory Visit (INDEPENDENT_AMBULATORY_CARE_PROVIDER_SITE_OTHER): Payer: Worker's Compensation | Admitting: Surgical

## 2022-03-11 DIAGNOSIS — Z9889 Other specified postprocedural states: Secondary | ICD-10-CM

## 2022-03-12 ENCOUNTER — Encounter: Payer: Self-pay | Admitting: Orthopedic Surgery

## 2022-03-12 NOTE — Progress Notes (Signed)
   Post-Op Visit Note   Patient: Jared Graham           Date of Birth: 07/14/1996           MRN: [id number] Visit Date: 03/11/2022 PCP: Pcp, No   Assessment & Plan:  Chief Complaint:  Chief Complaint  Patient presents with   Left Shoulder - Routine Post Op   Visit Diagnoses:  1. Status post labral repair of shoulder     Plan: Patient is a 26 year old male who presents s/p left shoulder arthroscopic posterior and anterior labral repair on 03/04/2022.  Doing well with no significant complaints.  No episodes of instability.  He has been in the sling full-time but occasionally will come out of the sling for showering and when he is sedentary watching TV.  He is not lifting anything with the arm.  He is not performing any active or passive shoulder range of motion.  No fevers, chills, night sweats, chest pain, shortness of breath.  On exam, incisions are healing well without evidence of infection or dehiscence.  Sutures removed and replaced Steri-Strips.  Axillary nerve intact with deltoid firing.  Intact EPL, FPL, finger abduction, finger adduction, pronation/supination, bicep, tricep, deltoid.  Stable to anterior and posterior directed forces today.  Plan is continue with sling immobilization for 2 weeks.  Follow-up in 2 weeks for clinical recheck with Dr. August Saucer and initiation of below shoulder range of motion.  Anticipate being out of work for 3 to 4 months which was discussed with patient and case worker today.  Follow-Up Instructions: No follow-ups on file.   Orders:  No orders of the defined types were placed in this encounter.  No orders of the defined types were placed in this encounter.   Imaging: No results found.  PMFS History: Patient Active Problem List   Diagnosis Date Noted   Dislocation, shoulder, anterior, left, subsequent encounter 08/09/2020   No past medical history on file.  No family history on file.  No past surgical history on file. Social History    Occupational History   Not on file  Tobacco Use   Smoking status: Never   Smokeless tobacco: Never  Substance and Sexual Activity   Alcohol use: Yes   Drug use: Not on file   Sexual activity: Not on file

## 2022-03-25 ENCOUNTER — Ambulatory Visit (INDEPENDENT_AMBULATORY_CARE_PROVIDER_SITE_OTHER): Payer: Worker's Compensation | Admitting: Orthopedic Surgery

## 2022-03-25 ENCOUNTER — Encounter: Payer: Self-pay | Admitting: Orthopedic Surgery

## 2022-03-25 DIAGNOSIS — Z9889 Other specified postprocedural states: Secondary | ICD-10-CM

## 2022-03-25 NOTE — Progress Notes (Signed)
   Post-Op Visit Note   Patient: Jared Graham           Date of Birth: 14-Feb-1996           MRN: [id number] Visit Date: 03/25/2022 PCP: Pcp, No   Assessment & Plan:  Chief Complaint:  Chief Complaint  Patient presents with   Left Shoulder - Routine Post Op    LEFT SHOULDER  (surgery date 03-04-22)   Visit Diagnoses:  1. Status post labral repair of shoulder     Plan: Jared Graham is a 26 year old patient with left shoulder arthroscopy and anterior posterior and SLAP repair performed arthroscopically.  That was done 03/04/2022.  He is doing well.  He has been in sling.  On exam his shoulder does feel stable.  Deltoid fires.  He has predictably stiff range of motion at this time.  Plan is out of work for at least 3 more weeks.  Anticipate that he will not really be ready to go to work for about 3 more months.  Physical therapy prescription written 3 times a week for the next 3 weeks to work on pendulum exercises along with external rotation less than or equal to 30 forward flexion and abduction less than or equal to 90 degrees.  In 3 weeks we will check him clinically and initiate range of motion overhead along with strengthening.  Follow-Up Instructions: No follow-ups on file.   Orders:  No orders of the defined types were placed in this encounter.  No orders of the defined types were placed in this encounter.   Imaging: No results found.  PMFS History: Patient Active Problem List   Diagnosis Date Noted   Dislocation, shoulder, anterior, left, subsequent encounter 08/09/2020   No past medical history on file.  No family history on file.  No past surgical history on file. Social History   Occupational History   Not on file  Tobacco Use   Smoking status: Never   Smokeless tobacco: Never  Substance and Sexual Activity   Alcohol use: Yes   Drug use: Not on file   Sexual activity: Not on file

## 2022-04-10 ENCOUNTER — Telehealth: Payer: Self-pay | Admitting: Orthopedic Surgery

## 2022-04-10 NOTE — Telephone Encounter (Signed)
Pt called asking for a call back from Lauren F. Pt states he is not sure if he need to keep upcoming appt  or wait until after pt sessions. Please call pt at [phone].

## 2022-04-10 NOTE — Telephone Encounter (Signed)
IC advised per Dr Dean 

## 2022-04-10 NOTE — Telephone Encounter (Signed)
Wait until after pt thx

## 2022-04-10 NOTE — Telephone Encounter (Signed)
IC patient again to further clarify per Dr August Saucer that he should keep appt for Monday.

## 2022-04-15 ENCOUNTER — Ambulatory Visit (INDEPENDENT_AMBULATORY_CARE_PROVIDER_SITE_OTHER): Payer: Worker's Compensation | Admitting: Orthopedic Surgery

## 2022-04-15 DIAGNOSIS — Z9889 Other specified postprocedural states: Secondary | ICD-10-CM

## 2022-04-15 MED ORDER — MELOXICAM 15 MG PO TABS: ORAL_TABLET | ORAL | 0 refills | Status: AC

## 2022-04-16 ENCOUNTER — Encounter: Payer: Self-pay | Admitting: Orthopedic Surgery

## 2022-04-16 NOTE — Progress Notes (Signed)
   Post-Op Visit Note   Patient: Jared Graham           Date of Birth: 10-09-1995           MRN: [id number] Visit Date: 04/15/2022 PCP: Pcp, No   Assessment & Plan:  Chief Complaint:  Chief Complaint  Patient presents with   Left Shoulder - Routine Post Op     03/04/22 left shoulder arthroscopy and anterior posterior and SLAP repair performed arthroscopically   Visit Diagnoses:  1. Status post labral repair of shoulder     Plan: Jared Graham is a 26 year old patient who is now about 6 weeks out left shoulder arthroscopy with SLAP repair.  He has been in a sling.  Had 3 physical therapy visits.  Taking Tylenol before therapy.  On examination he has passive range of motion of 20/90/110.  At this time I would like for him to continue in [facility] physical therapy for rotator cuff strengthening scapular stabilizer strengthening as well as overhead motion as tolerated.  Out of work 6 more weeks.  Mobic 15 mg a day for 4 weeks and 6-week return.  Would like for him to limit his external rotation to less than or equal to 45 degrees.  Abduction as tolerated.  Follow-Up Instructions: No follow-ups on file.   Orders:  No orders of the defined types were placed in this encounter.  Meds ordered this encounter  Medications   meloxicam (MOBIC) 15 MG tablet    Sig: 1 po q d x 4 weeks    Dispense:  30 tablet    Refill:  0    Imaging: No results found.  PMFS History: Patient Active Problem List   Diagnosis Date Noted   Dislocation, shoulder, anterior, left, subsequent encounter 08/09/2020   No past medical history on file.  No family history on file.  No past surgical history on file. Social History   Occupational History   Not on file  Tobacco Use   Smoking status: Never   Smokeless tobacco: Never  Substance and Sexual Activity   Alcohol use: Yes   Drug use: Not on file   Sexual activity: Not on file

## 2022-05-27 ENCOUNTER — Ambulatory Visit (INDEPENDENT_AMBULATORY_CARE_PROVIDER_SITE_OTHER): Payer: Worker's Compensation | Admitting: Orthopedic Surgery

## 2022-05-27 ENCOUNTER — Encounter: Payer: Self-pay | Admitting: Orthopedic Surgery

## 2022-05-27 ENCOUNTER — Other Ambulatory Visit: Payer: Self-pay

## 2022-05-27 VITALS — Ht 71.0 in | Wt 239.0 lb

## 2022-05-27 DIAGNOSIS — Z9889 Other specified postprocedural states: Secondary | ICD-10-CM

## 2022-05-30 ENCOUNTER — Encounter: Payer: Self-pay | Admitting: Orthopedic Surgery

## 2022-05-30 NOTE — Progress Notes (Signed)
   Post-Op Visit Note   Patient: Jared Graham           Date of Birth: 1996/07/23           MRN: [id number] Visit Date: 05/27/2022 PCP: Pcp, No   Assessment & Plan:  Chief Complaint:  Chief Complaint  Patient presents with   Left Shoulder - Follow-up    03/04/2022 left shoulder arthroscopy, anterior and posterior SLAP repair   Visit Diagnoses:  1. Status post labral repair of shoulder     Plan: Jared Graham is now about 3 months out left shoulder arthroscopy with anterior and posterior SLAP repair.  Family son is in the NICU and they are staying in [place] currently.  Therapy has not been done for about 2 weeks.  Having a little bit of biceps pain.  Taking meloxicam.  Also taking Robaxin.  On examination his range of motion is 40/85/150.  Shoulder is stable.  No coarse popping or grinding or mechanical symptoms in the shoulder.  Plan at this time is out of work for 6 more weeks.  6-week return.  New physical therapy to start working on functional strengthening activities.  I think we can project a return to work date at his next clinical appointment.  For now he needs to continue to work on more functional strengthening.  I do want him to avoid resisted biceps work though until his next appointment.  Jared Graham is present for discussion of treatment plan today  Follow-Up Instructions: No follow-ups on file.   Orders:  No orders of the defined types were placed in this encounter.  No orders of the defined types were placed in this encounter.   Imaging: No results found.  PMFS History: Patient Active Problem List   Diagnosis Date Noted   Dislocation, shoulder, anterior, left, subsequent encounter 08/09/2020   No past medical history on file.  No family history on file.  No past surgical history on file. Social History   Occupational History   Not on file  Tobacco Use   Smoking status: Never   Smokeless tobacco: Never  Substance and Sexual Activity   Alcohol use:  Yes   Drug use: Not on file   Sexual activity: Not on file

## 2022-06-10 ENCOUNTER — Telehealth: Payer: Self-pay | Admitting: Orthopedic Surgery

## 2022-06-10 NOTE — Telephone Encounter (Signed)
Physical therapy is wondering there is any restrictions for this pt?   Cb [phone]

## 2022-06-11 NOTE — Telephone Encounter (Signed)
faxed

## 2022-06-11 NOTE — Telephone Encounter (Signed)
No biceps resistance work.  No stretching of the anterior or posterior capsule.  Okay for deltoid strengthening and rotator cuff strengthening.  Okay for scapular stabilizer strengthening.  We will likely start doing some resistive biceps after his next appointment.

## 2022-06-11 NOTE — Telephone Encounter (Signed)
I called, spoke with Reagan at [facility]. She needs last office note and protocol faxed to her at [phone].

## 2022-07-08 ENCOUNTER — Ambulatory Visit (INDEPENDENT_AMBULATORY_CARE_PROVIDER_SITE_OTHER): Payer: Worker's Compensation | Admitting: Orthopedic Surgery

## 2022-07-08 DIAGNOSIS — Z9889 Other specified postprocedural states: Secondary | ICD-10-CM | POA: Diagnosis not present

## 2022-07-09 ENCOUNTER — Encounter: Payer: Self-pay | Admitting: Orthopedic Surgery

## 2022-07-09 NOTE — Progress Notes (Unsigned)
   Office Visit Note   Patient: Jared Graham           Date of Birth: Nov 30, 1995           MRN: [id number] Visit Date: 07/08/2022 Requested by: No referring provider defined for this encounter. PCP: Pcp, No  Subjective: Chief Complaint  Patient presents with   Left Shoulder - Follow-up    HPI: Manjinder Florendo is a 26 y.o. male who presents to the office reporting left shoulder pain.  Patient underwent left shoulder arthroscopy with anterior and posterior labral repair and SLAP repair 03/04/2022.  Has not started any biceps training yet or reaching behind his back.  Doing physical therapy 3 times a week.  Taking Mobic 2-3 times a week.  Would like to change that ibuprofen.  He is also using a TENS unit.  Denies any instability.  His son remains in the neurointensive care unit.  Laying on the right side increases his mild pain on the left.  He has been out of work.  Crossed arm adduction does give him some pain..                ROS: All systems reviewed are negative as they relate to the chief complaint within the history of present illness.  Patient denies fevers or chills.  Assessment & Plan: Visit Diagnoses:  1. Status post labral repair of shoulder     Plan: Impression is slow but steady progress with left shoulder.  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  Out of work 6 weeks but okay to start doing some posterior capsular stretching and bicep strengthening.  Follow-up in 6 weeks for clinical recheck possible FCE and return to work at that time.  Follow-Up Instructions: No follow-ups on file.   Orders:  No orders of the defined types were placed in this encounter.  No orders of the defined types were placed in this encounter.     Procedures: No procedures performed   Clinical Data: No additional findings.  Objective: Vital Signs: There were no vitals taken for  this visit.  Physical Exam:  Constitutional: Patient appears well-developed HEENT:  Head: Normocephalic Eyes:EOM are normal Neck: Normal range of motion Cardiovascular: Normal rate Pulmonary/chest: Effort normal Neurologic: Patient is alert Skin: Skin is warm Psychiatric: Patient has normal mood and affect  Ortho Exam: Ortho exam demonstrates range of motion on the left of 25/70/150.  Very good stability.  Negative O'Brien's testing and no coarse grinding or crepitus on the left-hand side.  Motor sensory function to the shoulder intact.  Specialty Comments:  No specialty comments available.  Imaging: No results found.   PMFS History: Patient Active Problem List   Diagnosis Date Noted   Dislocation, shoulder, anterior, left, subsequent encounter 08/09/2020   No past medical history on file.  No family history on file.  No past surgical history on file. Social History   Occupational History   Not on file  Tobacco Use   Smoking status: Never   Smokeless tobacco: Never  Substance and Sexual Activity   Alcohol use: Yes   Drug use: Not on file   Sexual activity: Not on file

## 2022-08-21 ENCOUNTER — Ambulatory Visit: Payer: BC Managed Care – PPO | Admitting: Orthopedic Surgery

## 2022-09-02 ENCOUNTER — Ambulatory Visit (INDEPENDENT_AMBULATORY_CARE_PROVIDER_SITE_OTHER): Payer: Worker's Compensation | Admitting: Orthopedic Surgery

## 2022-09-02 DIAGNOSIS — Z9889 Other specified postprocedural states: Secondary | ICD-10-CM | POA: Diagnosis not present

## 2022-09-03 NOTE — Progress Notes (Signed)
   Office Visit Note   Patient: Jared Graham           Date of Birth: Oct 08, 1995           MRN: [id number] Visit Date: 09/02/2022 Requested by: No referring provider defined for this encounter. PCP: Pcp, No  Subjective: Chief Complaint  Patient presents with   Other    left shoulder arthroscopy with anterior and posterior labral repair and SLAP repair 03/04/2022.    HPI: Jared Graham is a 27 y.o. male who presents to the office reporting left shoulder pain.  Patient underwent left shoulder anterior and posterior labral repair and SLAP repair 03/04/2022.  Doing about the same.  Has some dull aching for which he takes ibuprofen daily.  Does physical therapy 3 times a week.  Denies any shoulder instability.  He has been doing some rowing and triceps.  Pain will occasionally wake him up from sleep at night..                ROS: All systems reviewed are negative as they relate to the chief complaint within the history of present illness.  Patient denies fevers or chills.  Assessment & Plan: Visit Diagnoses:  1. Status post labral repair of shoulder     Plan: Impression is left shoulder pain about 6 months out SLAP repair anterior and posterior labral repair.  Range of motion is pretty reasonable.  Plan is work conditioning 3 times a week for the next 4 weeks.  I think we can do an FCE at that point as well.  He will be out of work for at least 6 weeks.  Follow-Up Instructions: No follow-ups on file.   Orders:  Orders Placed This Encounter  Procedures   Ambulatory referral to Physical Therapy   Ambulatory referral to Physical Therapy   No orders of the defined types were placed in this encounter.     Procedures: No procedures performed   Clinical Data: No additional findings.  Objective: Vital Signs: There were no vitals taken for this visit.  Physical Exam:  Constitutional: Patient appears well-developed HEENT:  Head: Normocephalic Eyes:EOM are normal Neck: Normal  range of motion Cardiovascular: Normal rate Pulmonary/chest: Effort normal Neurologic: Patient is alert Skin: Skin is warm Psychiatric: Patient has normal mood and affect  Ortho Exam: Ortho exam demonstrates very good shoulder stability anterior and posterior.  No popping or locking with internal/external rotation of the shoulder.  Negative O'Brien's.  Strength is good to rotator cuff testing.  Specialty Comments:  No specialty comments available.  Imaging: No results found.   PMFS History: Patient Active Problem List   Diagnosis Date Noted   Dislocation, shoulder, anterior, left, subsequent encounter 08/09/2020   No past medical history on file.  No family history on file.  No past surgical history on file. Social History   Occupational History   Not on file  Tobacco Use   Smoking status: Never   Smokeless tobacco: Never  Substance and Sexual Activity   Alcohol use: Yes   Drug use: Not on file   Sexual activity: Not on file

## 2022-09-05 ENCOUNTER — Encounter: Payer: Self-pay | Admitting: Orthopedic Surgery

## 2022-12-18 ENCOUNTER — Encounter: Payer: Self-pay | Admitting: Orthopedic Surgery

## 2022-12-18 ENCOUNTER — Ambulatory Visit (INDEPENDENT_AMBULATORY_CARE_PROVIDER_SITE_OTHER): Payer: Worker's Compensation | Admitting: Orthopedic Surgery

## 2022-12-18 DIAGNOSIS — Z9889 Other specified postprocedural states: Secondary | ICD-10-CM

## 2022-12-18 NOTE — Progress Notes (Signed)
   Office Visit Note   Patient: Jared Graham           Date of Birth: 10-09-1995           MRN: [id number] Visit Date: 12/18/2022 Requested by: No referring provider defined for this encounter. PCP: Pcp, No  Subjective: Chief Complaint  Patient presents with   Other     Review FCE    HPI: Jared Graham is a 27 y.o. male who presents to the office reporting Jared Graham is a 27 year old patient who underwent left shoulder stabilization surgery in July.  Last seen for FCE.  That result was reviewed with the patient today.  Overall he is doing well.  He is here with his son Jared Graham.  The Workmen's Comp. claim was initially a [place] state claim..                ROS: All systems reviewed are negative as they relate to the chief complaint within the history of present illness.  Patient denies fevers or chills.  Assessment & Plan: Visit Diagnoses:  1. Status post labral repair of shoulder     Plan: Impression is patient is doing well following left shoulder surgery.  Had very good effort on the FCE.  He is rated up to very heavy labor..  Unclear which rating system [place] uses for permanent partial disability.  By [employer] he is rated at 10% permanent partial disability.  By [facility] guidelines he is rated at 5% permanent partial disability.  Follow-Up Instructions: No follow-ups on file.   Orders:  No orders of the defined types were placed in this encounter.  No orders of the defined types were placed in this encounter.     Procedures: No procedures performed   Clinical Data: No additional findings.  Objective: Vital Signs: There were no vitals taken for this visit.  Physical Exam:  Constitutional: Patient appears well-developed HEENT:  Head: Normocephalic Eyes:EOM are normal Neck: Normal range of motion Cardiovascular: Normal rate Pulmonary/chest: Effort normal Neurologic: Patient is alert Skin: Skin is warm Psychiatric: Patient has normal mood  and affect  Ortho Exam: Ortho exam demonstrates excellent range of motion of that left arm.  Very good stability.  Slight weakness with empty can testing.  No popping or grinding in that left shoulder with range of motion.  Strength is good.  Biceps contour is normal.  No AC joint tenderness.  Rehab nurse case manager Jared Graham is present for discussion of treatment plan today  Specialty Comments:  No specialty comments available.  Imaging: No results found.   PMFS History: Patient Active Problem List   Diagnosis Date Noted   Dislocation, shoulder, anterior, left, subsequent encounter 08/09/2020   No past medical history on file.  No family history on file.  No past surgical history on file. Social History   Occupational History   Not on file  Tobacco Use   Smoking status: Never   Smokeless tobacco: Never  Substance and Sexual Activity   Alcohol use: Yes   Drug use: Not on file   Sexual activity: Not on file
